# Patient Record
Sex: Female | Born: 1955 | Race: White | Hispanic: No | Marital: Married | Smoking: Never smoker
Health system: Southern US, Community
[De-identification: ages and names within clinical notes are randomized; demographics above are authoritative.]

## PROBLEM LIST (undated history)

## (undated) DIAGNOSIS — C4491 Basal cell carcinoma of skin, unspecified: Secondary | ICD-10-CM

## (undated) DIAGNOSIS — D0590 Unspecified type of carcinoma in situ of unspecified breast: Secondary | ICD-10-CM

## (undated) DIAGNOSIS — E785 Hyperlipidemia, unspecified: Secondary | ICD-10-CM

## (undated) DIAGNOSIS — N841 Polyp of cervix uteri: Secondary | ICD-10-CM

## (undated) DIAGNOSIS — C801 Malignant (primary) neoplasm, unspecified: Secondary | ICD-10-CM

## (undated) DIAGNOSIS — D259 Leiomyoma of uterus, unspecified: Secondary | ICD-10-CM

## (undated) HISTORY — DX: Basal cell carcinoma of skin, unspecified: C44.91

## (undated) HISTORY — PX: TONSILLECTOMY: SUR1361

## (undated) HISTORY — DX: Unspecified type of carcinoma in situ of unspecified breast: D05.90

## (undated) HISTORY — DX: Polyp of cervix uteri: N84.1

## (undated) HISTORY — PX: WISDOM TOOTH EXTRACTION: SHX21

## (undated) HISTORY — PX: DILATION AND CURETTAGE OF UTERUS: SHX78

## (undated) HISTORY — PX: BREAST LUMPECTOMY: SHX2

## (undated) HISTORY — DX: Leiomyoma of uterus, unspecified: D25.9

## (undated) HISTORY — DX: Malignant (primary) neoplasm, unspecified: C80.1

---

## 1994-09-09 DIAGNOSIS — C801 Malignant (primary) neoplasm, unspecified: Secondary | ICD-10-CM

## 1994-09-09 HISTORY — PX: SKIN CANCER EXCISION: SHX779

## 1994-09-09 HISTORY — DX: Malignant (primary) neoplasm, unspecified: C80.1

## 1996-04-09 HISTORY — PX: ECTOPIC PREGNANCY SURGERY: SHX613

## 1997-11-07 ENCOUNTER — Other Ambulatory Visit: Admission: RE | Admit: 1997-11-07 | Discharge: 1997-11-07 | Payer: Self-pay | Admitting: Gynecology

## 1998-06-01 ENCOUNTER — Other Ambulatory Visit: Admission: RE | Admit: 1998-06-01 | Discharge: 1998-06-01 | Payer: Self-pay | Admitting: Gynecology

## 1998-11-28 ENCOUNTER — Other Ambulatory Visit: Admission: RE | Admit: 1998-11-28 | Discharge: 1998-11-28 | Payer: Self-pay | Admitting: Gynecology

## 1999-06-14 ENCOUNTER — Other Ambulatory Visit: Admission: RE | Admit: 1999-06-14 | Discharge: 1999-06-14 | Payer: Self-pay | Admitting: Gynecology

## 2000-06-30 ENCOUNTER — Other Ambulatory Visit: Admission: RE | Admit: 2000-06-30 | Discharge: 2000-06-30 | Payer: Self-pay | Admitting: Gynecology

## 2001-07-22 ENCOUNTER — Other Ambulatory Visit: Admission: RE | Admit: 2001-07-22 | Discharge: 2001-07-22 | Payer: Self-pay | Admitting: Gynecology

## 2001-12-08 ENCOUNTER — Other Ambulatory Visit: Admission: RE | Admit: 2001-12-08 | Discharge: 2001-12-08 | Payer: Self-pay | Admitting: Gynecology

## 2002-07-15 ENCOUNTER — Other Ambulatory Visit: Admission: RE | Admit: 2002-07-15 | Discharge: 2002-07-15 | Payer: Self-pay | Admitting: Gynecology

## 2003-08-09 ENCOUNTER — Other Ambulatory Visit: Admission: RE | Admit: 2003-08-09 | Discharge: 2003-08-09 | Payer: Self-pay | Admitting: Gynecology

## 2004-08-20 ENCOUNTER — Other Ambulatory Visit: Admission: RE | Admit: 2004-08-20 | Discharge: 2004-08-20 | Payer: Self-pay | Admitting: Gynecology

## 2004-12-14 ENCOUNTER — Ambulatory Visit: Admission: RE | Admit: 2004-12-14 | Discharge: 2004-12-14 | Payer: Self-pay | Admitting: Gynecology

## 2005-02-12 ENCOUNTER — Ambulatory Visit (HOSPITAL_COMMUNITY): Admission: RE | Admit: 2005-02-12 | Discharge: 2005-02-12 | Payer: Self-pay | Admitting: Gynecology

## 2005-02-12 ENCOUNTER — Encounter (INDEPENDENT_AMBULATORY_CARE_PROVIDER_SITE_OTHER): Payer: Self-pay | Admitting: Specialist

## 2005-04-05 ENCOUNTER — Ambulatory Visit: Admission: RE | Admit: 2005-04-05 | Discharge: 2005-04-05 | Payer: Self-pay | Admitting: Gynecology

## 2005-09-09 DIAGNOSIS — D259 Leiomyoma of uterus, unspecified: Secondary | ICD-10-CM

## 2005-09-09 HISTORY — DX: Leiomyoma of uterus, unspecified: D25.9

## 2006-05-28 ENCOUNTER — Ambulatory Visit (HOSPITAL_COMMUNITY): Admission: RE | Admit: 2006-05-28 | Discharge: 2006-05-28 | Payer: Self-pay | Admitting: Obstetrics & Gynecology

## 2009-07-10 ENCOUNTER — Ambulatory Visit (HOSPITAL_COMMUNITY): Admission: RE | Admit: 2009-07-10 | Discharge: 2009-07-10 | Payer: Self-pay | Admitting: Obstetrics & Gynecology

## 2009-10-10 DIAGNOSIS — N841 Polyp of cervix uteri: Secondary | ICD-10-CM

## 2009-10-10 HISTORY — PX: CERVICAL POLYPECTOMY: SHX88

## 2009-10-10 HISTORY — DX: Polyp of cervix uteri: N84.1

## 2009-10-13 ENCOUNTER — Ambulatory Visit (HOSPITAL_COMMUNITY): Admission: RE | Admit: 2009-10-13 | Discharge: 2009-10-13 | Payer: Self-pay | Admitting: Obstetrics & Gynecology

## 2010-11-12 ENCOUNTER — Other Ambulatory Visit: Payer: Self-pay | Admitting: Radiology

## 2010-11-13 ENCOUNTER — Other Ambulatory Visit: Payer: Self-pay | Admitting: Radiology

## 2010-11-13 DIAGNOSIS — C50911 Malignant neoplasm of unspecified site of right female breast: Secondary | ICD-10-CM

## 2010-11-15 ENCOUNTER — Ambulatory Visit
Admission: RE | Admit: 2010-11-15 | Discharge: 2010-11-15 | Disposition: A | Discharge status: Home / Self Care | Payer: BC Managed Care – PPO | Source: Ambulatory Visit | Attending: Radiology | Admitting: Radiology

## 2010-11-15 DIAGNOSIS — C50911 Malignant neoplasm of unspecified site of right female breast: Secondary | ICD-10-CM

## 2010-11-15 MED ORDER — GADOBENATE DIMEGLUMINE 529 MG/ML IV SOLN
9.0000 mL | Freq: Once | INTRAVENOUS | Status: AC | PRN
  Administered 2010-11-15: 9 mL via INTRAVENOUS

## 2010-11-19 ENCOUNTER — Other Ambulatory Visit (HOSPITAL_COMMUNITY): Payer: Self-pay | Admitting: General Surgery

## 2010-11-19 DIAGNOSIS — C50919 Malignant neoplasm of unspecified site of unspecified female breast: Secondary | ICD-10-CM

## 2010-11-28 LAB — CBC
HCT: 42.9 % (ref 36.0–46.0)
Hemoglobin: 14.3 g/dL (ref 12.0–15.0)
MCHC: 33.4 g/dL (ref 30.0–36.0)
MCV: 93.1 fL (ref 78.0–100.0)
Platelets: 264 10*3/uL (ref 150–400)
RBC: 4.61 MIL/uL (ref 3.87–5.11)
RDW: 13.8 % (ref 11.5–15.5)
WBC: 6.6 10*3/uL (ref 4.0–10.5)

## 2010-11-28 LAB — PREGNANCY, URINE: Preg Test, Ur: NEGATIVE

## 2010-11-29 ENCOUNTER — Encounter (HOSPITAL_BASED_OUTPATIENT_CLINIC_OR_DEPARTMENT_OTHER)
Admission: RE | Admit: 2010-11-29 | Discharge: 2010-11-29 | Disposition: A | Discharge status: Home / Self Care | Payer: BC Managed Care – PPO | Source: Ambulatory Visit | Attending: General Surgery | Admitting: General Surgery

## 2010-11-29 ENCOUNTER — Ambulatory Visit
Admission: RE | Admit: 2010-11-29 | Discharge: 2010-11-29 | Disposition: A | Discharge status: Home / Self Care | Payer: No Typology Code available for payment source | Source: Ambulatory Visit | Attending: General Surgery | Admitting: General Surgery

## 2010-11-29 ENCOUNTER — Other Ambulatory Visit: Payer: Self-pay | Admitting: General Surgery

## 2010-11-29 DIAGNOSIS — Z01811 Encounter for preprocedural respiratory examination: Secondary | ICD-10-CM

## 2010-11-29 LAB — DIFFERENTIAL
Basophils Absolute: 0.1 10*3/uL (ref 0.0–0.1)
Basophils Relative: 1 % (ref 0–1)
Eosinophils Absolute: 0 10*3/uL (ref 0.0–0.7)
Eosinophils Relative: 0 % (ref 0–5)
Lymphocytes Relative: 32 % (ref 12–46)
Lymphs Abs: 2.4 10*3/uL (ref 0.7–4.0)
Monocytes Absolute: 0.7 10*3/uL (ref 0.1–1.0)
Monocytes Relative: 9 % (ref 3–12)
Neutro Abs: 4.3 10*3/uL (ref 1.7–7.7)
Neutrophils Relative %: 57 % (ref 43–77)

## 2010-11-29 LAB — COMPREHENSIVE METABOLIC PANEL
ALT: 37 U/L — ABNORMAL HIGH (ref 0–35)
AST: 21 U/L (ref 0–37)
Albumin: 4.2 g/dL (ref 3.5–5.2)
Alkaline Phosphatase: 34 U/L — ABNORMAL LOW (ref 39–117)
BUN: 10 mg/dL (ref 6–23)
CO2: 30 mEq/L (ref 19–32)
Calcium: 9.6 mg/dL (ref 8.4–10.5)
Chloride: 102 mEq/L (ref 96–112)
Creatinine, Ser: 0.64 mg/dL (ref 0.4–1.2)
GFR calc Af Amer: >60 mL/min (ref >60)
GFR calc non Af Amer: >60 mL/min (ref >60)
Glucose, Bld: 98 mg/dL (ref 70–99)
Potassium: 5.1 mEq/L (ref 3.5–5.1)
Sodium: 139 mEq/L (ref 135–145)
Total Bilirubin: 0.9 mg/dL (ref 0.3–1.2)
Total Protein: 7.1 g/dL (ref 6.0–8.3)

## 2010-11-29 LAB — CBC
HCT: 42.7 % (ref 36.0–46.0)
Hemoglobin: 14 g/dL (ref 12.0–15.0)
MCH: 30.8 pg (ref 26.0–34.0)
MCHC: 32.8 g/dL (ref 30.0–36.0)
MCV: 93.8 fL (ref 78.0–100.0)
Platelets: 266 10*3/uL (ref 150–400)
RBC: 4.55 MIL/uL (ref 3.87–5.11)
RDW: 13.9 % (ref 11.5–15.5)
WBC: 7.5 10*3/uL (ref 4.0–10.5)

## 2010-11-29 LAB — CANCER ANTIGEN 27.29: CA 27.29: 6 U/mL (ref 0–39)

## 2010-12-03 ENCOUNTER — Other Ambulatory Visit: Payer: Self-pay | Admitting: General Surgery

## 2010-12-03 ENCOUNTER — Ambulatory Visit (HOSPITAL_BASED_OUTPATIENT_CLINIC_OR_DEPARTMENT_OTHER)
Admission: RE | Admit: 2010-12-03 | Discharge: 2010-12-03 | Disposition: A | Discharge status: Home / Self Care | Payer: BC Managed Care – PPO | Source: Ambulatory Visit | Attending: General Surgery | Admitting: General Surgery

## 2010-12-03 ENCOUNTER — Other Ambulatory Visit (HOSPITAL_COMMUNITY): Payer: No Typology Code available for payment source

## 2010-12-03 ENCOUNTER — Ambulatory Visit (HOSPITAL_COMMUNITY): Payer: BC Managed Care – PPO | Attending: General Surgery

## 2010-12-03 DIAGNOSIS — D0590 Unspecified type of carcinoma in situ of unspecified breast: Secondary | ICD-10-CM | POA: Insufficient documentation

## 2010-12-03 DIAGNOSIS — J449 Chronic obstructive pulmonary disease, unspecified: Secondary | ICD-10-CM | POA: Insufficient documentation

## 2010-12-03 DIAGNOSIS — J4489 Other specified chronic obstructive pulmonary disease: Secondary | ICD-10-CM | POA: Insufficient documentation

## 2010-12-03 DIAGNOSIS — C50919 Malignant neoplasm of unspecified site of unspecified female breast: Secondary | ICD-10-CM

## 2010-12-03 DIAGNOSIS — Z01812 Encounter for preprocedural laboratory examination: Secondary | ICD-10-CM | POA: Insufficient documentation

## 2010-12-03 DIAGNOSIS — Z87891 Personal history of nicotine dependence: Secondary | ICD-10-CM | POA: Insufficient documentation

## 2010-12-03 DIAGNOSIS — Z01810 Encounter for preprocedural cardiovascular examination: Secondary | ICD-10-CM | POA: Insufficient documentation

## 2010-12-03 MED ORDER — TECHNETIUM TC 99M SULFUR COLLOID FILTERED
1.0000 | Freq: Once | INTRAVENOUS | Status: AC | PRN
  Administered 2010-12-03: 1 via INTRADERMAL

## 2010-12-05 NOTE — Op Note (Signed)
Connie Miller, Connie Miller                ACCOUNT NO.:  [id number]  MEDICAL RECORD NO.:  [id number]           PATIENT TYPE:  LOCATION:                                 FACILITY:  PHYSICIAN:  Juanetta Gosling, MD     DATE OF BIRTH:  DATE OF PROCEDURE:  12/03/2010 DATE OF DISCHARGE:                              OPERATIVE REPORT   PREOPERATIVE DIAGNOSIS:  Stage 0 right breast cancer.  POSTOPERATIVE DIAGNOSIS:  Stage 0 right breast cancer.  PROCEDURE: 1. Right breast wire-guided lumpectomy. 2. Injection of blue dye from met sentinel node identification. 3. Right axillary sentinel node biopsy x1 with count being 323.  SURGEON:  Troy Sine. Dwain Sarna, MD  ASSISTANT:  None.  ANESTHESIA:  General.  SPECIMENS: 1. Right breast tissue marked with a paint kit. 2. Right axillary sentinel node that was hot at 323 and blue.  DISPOSITION OF SPECIMEN:  Pathology.  ESTIMATED BLOOD LOSS:  Minimal.  COMPLICATIONS:  None.  DRAINS:  None.  DISPOSITION:  The patient to recovery room in stable condition.  INDICATIONS:  This is a 55 year old female underwent routine screening mammogram who had a short interval followup of calcifications in March 2012.  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 MRI does not show where the other significant abnormality.  She and I discussed all of the different options, decided to perform a lumpectomy. This area does appear just to be DCIS but due to its location in the very upper outer quadrant near her axilla,  I did discuss with her a sentinel node biopsy due to location and I recommended this to her as part of her treatment.  She understood the risks and benefits of this prior to beginning procedure.  After informed consent was obtained, the patient [facility] she had a wire placed by Dr. Derinda Late.  I had her mammograms and the report  available for my review.  She was then brought to [facility] where she was administered technetium in the standard periareolar fashion.  She was then taken to the operating room, was administered 1 g of intravenous cefazolin.  Sequential compression devices were placed on lower extremities prior to induction of anesthesia.  She was then placed under general anesthesia with an LMA.  Surgical time-out was then performed.  PROCEDURE: I then injected one mL in 4 cardinal positions around her areola of methylene blue saline mixture massages for 2 minutes.  She was then prepped and draped in standard sterile surgical fashion.  I then made a radial incision overlying the wire.  I did ellipse a small piece of skin overlying this as well.  This was taken down to the pectoralis muscle including the pectoralis fascia, Faxitron mammogram was taken to confirm removal of the calcifications as well as the clip and the wire.  The margins were all clear on this as well.  Hemostasis was observed.  I did lift the breast tissue off the pectoralis little bit all four directions.  I then performed a sentinel  node biopsy through the same incision.  I entered her axilla.  I used the NeoProbe to identify a hot node, this was also blue, I removed this, the background counts were less than 30 and the count of sentinel node was 323.  I then observed hemostasis.  I closed her axilla with 3-0 Vicryl suture.  I placed 2 clips in the deep position on the pectoralis muscle and one clip in each cardinal position around the breast tissue.  I then closed the deep breast tissue with the 2-0 Vicryl.  I did not close the lateral most portion of this and I then closed the dermis with a 3-0 Vicryl.  The skin with a 4-0 Monocryl in a subcuticular fashion.  Steri-Strips and sterile dressing were placed, a total of 20 mL of 0.25% Marcaine were infiltrated.  I then placed a breast binder round her.  She tolerated this well,  was extubated in the operating room, and transferred to recovery room in stable condition.     Juanetta Gosling, MD     MCW/MEDQ  D:  12/03/2010  T:  12/04/2010  Job:  [id number]  cc:   Barry Dienes. Eloise Harman, M.D. Dr. Jeralyn Ruths  Electronically Signed by Emelia Loron MD on 12/05/2010 12:27:17 PM

## 2010-12-10 ENCOUNTER — Encounter: Payer: No Typology Code available for payment source | Admitting: Oncology

## 2010-12-12 ENCOUNTER — Ambulatory Visit: Payer: BC Managed Care – PPO | Attending: Radiation Oncology | Admitting: Radiation Oncology

## 2010-12-12 DIAGNOSIS — L299 Pruritus, unspecified: Secondary | ICD-10-CM | POA: Insufficient documentation

## 2010-12-12 DIAGNOSIS — C50419 Malignant neoplasm of upper-outer quadrant of unspecified female breast: Secondary | ICD-10-CM | POA: Insufficient documentation

## 2010-12-12 DIAGNOSIS — Z803 Family history of malignant neoplasm of breast: Secondary | ICD-10-CM | POA: Insufficient documentation

## 2010-12-12 DIAGNOSIS — Z85828 Personal history of other malignant neoplasm of skin: Secondary | ICD-10-CM | POA: Insufficient documentation

## 2010-12-12 DIAGNOSIS — Z510 Encounter for antineoplastic radiation therapy: Secondary | ICD-10-CM | POA: Insufficient documentation

## 2010-12-12 DIAGNOSIS — Z7982 Long term (current) use of aspirin: Secondary | ICD-10-CM | POA: Insufficient documentation

## 2010-12-12 DIAGNOSIS — F411 Generalized anxiety disorder: Secondary | ICD-10-CM | POA: Insufficient documentation

## 2010-12-12 DIAGNOSIS — Y842 Radiological procedure and radiotherapy as the cause of abnormal reaction of the patient, or of later complication, without mention of misadventure at the time of the procedure: Secondary | ICD-10-CM | POA: Insufficient documentation

## 2010-12-24 ENCOUNTER — Encounter: Payer: BC Managed Care – PPO | Admitting: Genetic Counselor

## 2010-12-24 ENCOUNTER — Encounter: Payer: BC Managed Care – PPO | Admitting: Oncology

## 2011-01-04 ENCOUNTER — Encounter (INDEPENDENT_AMBULATORY_CARE_PROVIDER_SITE_OTHER): Payer: Self-pay | Admitting: General Surgery

## 2011-01-23 ENCOUNTER — Other Ambulatory Visit: Payer: Self-pay | Admitting: Dermatology

## 2011-01-25 NOTE — Op Note (Signed)
Connie Miller, Connie Miller                ACCOUNT NO.:  [id number]   MEDICAL RECORD NO.:  [id number]          PATIENT TYPE:  AMB   LOCATION:  DAY                          FACILITY:  [facility]   PHYSICIAN:  De Blanch, M.D.DATE OF BIRTH:  [birth year]   DATE OF PROCEDURE:  02/12/2005  DATE OF DISCHARGE:                                 OPERATIVE REPORT   PREOPERATIVE DIAGNOSIS:  Postmenopausal bleeding, endometrial mass, cervical  stenosis.   POSTOPERATIVE DIAGNOSIS:  Postmenopausal bleeding, endometrial mass,  cervical stenosis.   PROCEDURE:  Examination under anesthesia, D&C under ultrasound guidance.   SURGEON:  De Blanch, M.D.   FIRST ASSISTANT:  Telford Nab, R.N.   ANESTHESIA:  General LMA.   ESTIMATED BLOOD LOSS:  Minimal.   SURGICAL FINDINGS:  Examination under anesthesia revealed a stenotic upper  vagina and a cervix which was flush with the upper vagina. There were some  adhesions in the upper vagina initially obscured the view of the cervix. The  cervix os was stenotic. With ultrasound guidance, we were able to negotiate  the endocervical canal into the endometrial cavity. Imaging of the  endometrial cavity with ultrasound showed a sonolucent area consistent with  a polyp. At the completion of the surgical procedure, there was no residual  tissue identifiable by ultrasound.   DESCRIPTION OF PROCEDURE:  The patient was brought to the operating room and  after satisfactory attainment of general anesthesia was placed in the  lithotomy position in candy cane stirrups. Perineum, vagina and vulva were  prepped with Betadine, the patient was draped and a Foley catheter was  inserted. The bladder was filled with 250 mL of saline. Ultrasound imaging  of the pelvis was then begun identifying the uterus with its endometrial  mass. A weighted speculum was initially placed in the vagina, but because  the vagina was stenotic a Deaver was substituted both posteriorly  and  anteriorly. The cervix was identified and grasped with a tenaculum.  Adhesions in the upper vagina were opened with blunt dissection. Probing of  the cervix identified a stenotic cervical os and a wire probe was then  introduced through the cervical canal and into the endometrial cavity. This  was confirmed by ultrasound. The cervix was then dilated sequentially to a  23 gauge Pratt dilator. All of this was performed under ultrasound guidance.  A Randall stone forceps was then introduced into the uterus and with several  passes a considerable amount of what appeared to be endometrial polyp was  then removed. A sharp curette was then used to curette the entire  endometrial cavity down to the level of the basalis. Additional passes with  the Randall stone forceps retrieved  additional tissue. The uterus was then imaged and found to be empty. The  tenaculum was removed. The bladder was emptied and a Foley catheter removed.  The patient was awakened from anesthesia and taken to the recovery room in  satisfactory condition. Sponge, needle and instrument counts correct x2.       DC/MEDQ  D:  02/12/2005  T:  02/12/2005  Job:  [id number]  cc:   Leatha Gilding. Mezer, M.D.  [address]  [place]  [place] [address]  Fax: [phone]   Telford Nab, R.N.  [phone] [address]  [place], [place] [address]

## 2011-01-25 NOTE — Consult Note (Signed)
NAMEGISSELLA, Connie Miller                ACCOUNT NO.:  [id number]   MEDICAL RECORD NO.:  [id number]          PATIENT TYPE:  OUT   LOCATION:  GYN                          FACILITY:  [facility]   PHYSICIAN:  De Blanch, M.D.DATE OF BIRTH:  10/10/1955   DATE OF CONSULTATION:  DATE OF DISCHARGE:                                   CONSULTATION   A 55 year old white female returns, having undergone a D&C on February 12, 2005.  She had cervical stenosis and postmenopausal bleeding.  Using ultrasound  guidance, I was able to perform a D&C, which revealed a benign endometrial  polyp with secretory changes.  No malignancy or hyperplasia was noted.  The  patient has had an uncomplicated postoperative course.   PHYSICAL EXAMINATION:  VITAL SIGNS:  Weight 122 pounds.  ABDOMEN:  Soft and nontender.  No mass, organomegaly, ascites, or hernias  are noted.  PELVIC:  EG/BUS, vagina, bladder, and urethra are normal.  The cervix is  visualized, and the os appears to be patent at the present time.  No  bleeding is noted.  Bimanual reveals normal uterus.  No adnexal masses  noted.   IMPRESSION:  Good postoperative recovery following dilatation and curettage.  The patient is given the okay to return to full levels of activity,  including sexual intercourse.   I reviewed the patient's pathology and explained the natural history of  endometrial polyps.  She remains premenopausal, having regular cyclic menses  and has had one period since her D&C.  I would recommend she have annual  gynecologic examinations or be seen as needed for other gynecologic  problems.       DC/MEDQ  D:  04/05/2005  T:  04/05/2005  Job:  [id number]   cc:   Leatha Gilding. Mezer, M.D.  [address]  [place]  [place] [address]  Fax: [phone]   Telford Nab, R.N.  [phone]  [place], [place] [address]

## 2011-01-25 NOTE — Consult Note (Signed)
Connie Miller, Connie Miller                ACCOUNT NO.:  [id number]   MEDICAL RECORD NO.:  [id number]          PATIENT TYPE:  OUT   LOCATION:  GYN                          FACILITY:  [facility]   PHYSICIAN:  De Blanch, M.D.DATE OF BIRTH:  [birth year]   DATE OF CONSULTATION:  12/14/2004  DATE OF DISCHARGE:                                   CONSULTATION   NEW PATIENT CONSULTATION   REASON FOR CONSULTATION:  A 55 year old white female seen in consultation at  the request of Dr. Teodora Medici regarding apparent uterine polyps.   At the time of a routine office visit, it was noted the patient was having  intermenstrual bleeding and a saline hysterosonogram was performed on  September 19, 2004. Two polyps were identified. Subsequently, the patient was  taken to the outpatient operating room where a D&C was attempted. However,  the patient's cervix was extremely stenotic and entry into the endometrial  cavity could not be confirmed. The curetted tissue was essentially blood  clot.   The patient has no particular explanation for why she would have cervical  stenosis. She did undergo two hysterosalpingograms in the past but has not  had cryotherapy or cold knife conization or prior D&C's.   She denies any abdominal or pelvic pain or any other GI or GU symptoms.   PAST MEDICAL HISTORY:  Medical illnesses:  Migraine headaches.   Past surgical procedures:  Attempted D&C, in vitro fertilization,  laparoscopic salpingostomy for an ectopic pregnancy that required  postoperative methotrexate.   DRUG ALLERGIES:  None.   CURRENT MEDICATIONS:  Imitrex, multivitamins.   OBSTETRICAL HISTORY:  Gravida 0.   REVIEW OF SYSTEMS:  Negative except as noted above.   PHYSICAL EXAMINATION:  VITAL SIGNS:  Weight 123 pounds, height 5 feet 2  inches. Blood pressure 122/78, pulse 108.  GENERAL:  The patient is a healthy white female in no acute distress.  HEENT:  Negative.  NECK:  Supple without  thyromegaly.  LYMPH:  There is no supraclavicular or inguinal adenopathy.  ABDOMEN:  Soft, nontender. No masses, organomegaly, ascites or hernias are  noted.  PELVIC:  EG/BUS, vagina, bladder, urethra are normal except at the apex of  the vagina in the left fornix there is a considerable amount of scar tissue  resulting in deviation of the cervix, making the cervix difficult to  visualize. Bimanual exam reveals a normal uterus, no adnexal masses are  noted.   IMPRESSION:  Findings suggestive of endometrial polyps on sonohysterogram  with intermenstrual bleeding. Management options were discussed with the  patient and her husband at some length. The three options of management that  I would consider would be:  1.  Continued observation.  2.  Second attempt at [facility] using ultrasound guidance.  3.  Abdominal hysterectomy (given the patient's nulliparous status and the      narrowness of her vagina I do not believe a vaginal hysterectomy would      be accomplished easily).   The pros and cons of each of these approaches were discussed with the  patient and her husband at some  length. If she did choose to undergo a  hysterectomy, the pros and cons of preserving or removing tubes and ovaries  was also discussed. At the conclusion of the consultation the patient and  her husband wish to consider their options further and will contact Dr.  Chevis Pretty with their decision. We did discuss the possibility that if on  hysterectomy she was found to have endometrial cancer, the need for  peritoneal washings, bilateral salpingo-oophorectomy, and possibly pelvic  and periaortic lymphadenectomy. All their questions are answered and they  will contact us if we can be of additional service.      DC/MEDQ  D:  12/14/2004  T:  12/14/2004  Job:  [id number]   cc:   Leatha Gilding. Mezer, M.D.  [address]  [place]  [place] [address]  Fax: [phone]   Telford Nab, R.N.  [phone] [address]  [place],  [place] [address]

## 2011-02-14 ENCOUNTER — Encounter (HOSPITAL_BASED_OUTPATIENT_CLINIC_OR_DEPARTMENT_OTHER): Payer: BC Managed Care – PPO | Admitting: Oncology

## 2011-02-14 DIAGNOSIS — Z170 Estrogen receptor positive status [ER+]: Secondary | ICD-10-CM

## 2011-02-14 DIAGNOSIS — C50419 Malignant neoplasm of upper-outer quadrant of unspecified female breast: Secondary | ICD-10-CM

## 2011-03-18 ENCOUNTER — Encounter (INDEPENDENT_AMBULATORY_CARE_PROVIDER_SITE_OTHER): Payer: Self-pay | Admitting: General Surgery

## 2011-03-18 ENCOUNTER — Ambulatory Visit (INDEPENDENT_AMBULATORY_CARE_PROVIDER_SITE_OTHER): Payer: BC Managed Care – PPO | Admitting: General Surgery

## 2011-03-18 DIAGNOSIS — D0590 Unspecified type of carcinoma in situ of unspecified breast: Secondary | ICD-10-CM

## 2011-03-18 DIAGNOSIS — D0591 Unspecified type of carcinoma in situ of right breast: Secondary | ICD-10-CM | POA: Insufficient documentation

## 2011-03-18 NOTE — Progress Notes (Signed)
Subjective:     Patient ID: Connie Miller, female   DOB: 1955/11/26, 55 y.o.   MRN: [id number]    There were no vitals taken for this visit.    HPI This is a 55 her old female who I initially saw in March of 55. She had a mammographic abnormality on the right at that time. The core biopsy showed DCIS, atypical ductal hyperplasia. MRI did not show any other changes. She was taken to the operating room on March 26 underwent a right breast wire-guided lumpectomy and a right axillary sentinel lymph node biopsy. Her final pathology returned as no residual DCIS, atypical lobular hyperplasia, margin negative, one lymph node negative for tumor. This was ER positive at 99%. This was PR positive at 100%. She did well postoperatively. She had some initial difficulty with her right arm but is having no trouble with that at this point. She then went on to complete radiation therapy with Dr. Arnette Schaumann. She also is not taking tamoxifen per Pierce Crane. She comes in today for followup without any real complaints.  Review of Systems     Objective:   Physical Exam  Pulmonary/Chest: Right breast exhibits no inverted nipple, no nipple discharge and no skin change. Left breast exhibits no inverted nipple, no mass, no nipple discharge, no skin change and no tenderness.   Well healing right upper outer quadrant breast incision with some scar tissue present but no right breast masses, no right axillary adenopathy    Assessment:     Stage 0 right breast cancer s/p lumpectomy, sentinel node biopsy, xrt and now on tamoxifen    Plan:     Continue monthly SBE MMG in March I told her I would see her six months after Dr. Donnie Coffin We discussed at length her diagnosis as well as reason for tamoxifen

## 2011-05-01 ENCOUNTER — Encounter (INDEPENDENT_AMBULATORY_CARE_PROVIDER_SITE_OTHER): Payer: Self-pay | Admitting: General Surgery

## 2011-05-16 ENCOUNTER — Other Ambulatory Visit: Payer: Self-pay | Admitting: Oncology

## 2011-05-16 ENCOUNTER — Encounter (HOSPITAL_BASED_OUTPATIENT_CLINIC_OR_DEPARTMENT_OTHER): Payer: BC Managed Care – PPO | Admitting: Oncology

## 2011-05-16 DIAGNOSIS — C50419 Malignant neoplasm of upper-outer quadrant of unspecified female breast: Secondary | ICD-10-CM

## 2011-05-16 LAB — CBC WITH DIFFERENTIAL/PLATELET
BASO%: 1.3 % (ref 0.0–2.0)
Basophils Absolute: 0.1 10*3/uL (ref 0.0–0.1)
EOS%: 0.2 % (ref 0.0–7.0)
Eosinophils Absolute: 0 10*3/uL (ref 0.0–0.5)
HCT: 42.4 % (ref 34.8–46.6)
HGB: 14.2 g/dL (ref 11.6–15.9)
LYMPH%: 25.5 % (ref 14.0–49.7)
MCH: 31.6 pg (ref 25.1–34.0)
MCHC: 33.5 g/dL (ref 31.5–36.0)
MCV: 94.3 fL (ref 79.5–101.0)
MONO#: 0.4 10*3/uL (ref 0.1–0.9)
MONO%: 7.2 % (ref 0.0–14.0)
NEUT#: 3.9 10*3/uL (ref 1.5–6.5)
NEUT%: 65.8 % (ref 38.4–76.8)
Platelets: 197 10*3/uL (ref 145–400)
RBC: 4.49 10*6/uL (ref 3.70–5.45)
RDW: 13.3 % (ref 11.2–14.5)
WBC: 5.9 10*3/uL (ref 3.9–10.3)
lymph#: 1.5 10*3/uL (ref 0.9–3.3)

## 2011-05-17 LAB — COMPREHENSIVE METABOLIC PANEL
ALT: 21 U/L (ref 0–35)
AST: 20 U/L (ref 0–37)
Albumin: 4.5 g/dL (ref 3.5–5.2)
Alkaline Phosphatase: 30 U/L — ABNORMAL LOW (ref 39–117)
BUN: 15 mg/dL (ref 6–23)
CO2: 29 mEq/L (ref 19–32)
Calcium: 9.9 mg/dL (ref 8.4–10.5)
Chloride: 103 mEq/L (ref 96–112)
Creatinine, Ser: 0.74 mg/dL (ref 0.50–1.10)
Glucose, Bld: 92 mg/dL (ref 70–99)
Potassium: 3.9 mEq/L (ref 3.5–5.3)
Sodium: 141 mEq/L (ref 135–145)
Total Bilirubin: 0.7 mg/dL (ref 0.3–1.2)
Total Protein: 7.2 g/dL (ref 6.0–8.3)

## 2011-05-17 LAB — VITAMIN D 25 HYDROXY (VIT D DEFICIENCY, FRACTURES): Vit D, 25-Hydroxy: 41 ng/mL (ref 30–89)

## 2011-05-20 ENCOUNTER — Ambulatory Visit: Payer: No Typology Code available for payment source | Admitting: Radiation Oncology

## 2011-08-28 ENCOUNTER — Encounter: Payer: Self-pay | Admitting: Oncology

## 2011-09-12 ENCOUNTER — Ambulatory Visit: Payer: BC Managed Care – PPO | Admitting: Psychiatry

## 2011-09-12 NOTE — Progress Notes (Unsigned)
Patient seen for initial psychological evaluation.  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  The patient is amenable to continuing in therapy and her next appointment is 09-18-2011.

## 2011-09-13 ENCOUNTER — Other Ambulatory Visit: Payer: BC Managed Care – PPO

## 2011-09-18 ENCOUNTER — Ambulatory Visit (INDEPENDENT_AMBULATORY_CARE_PROVIDER_SITE_OTHER): Payer: BC Managed Care – PPO | Admitting: Psychiatry

## 2011-09-18 ENCOUNTER — Telehealth: Payer: Self-pay | Admitting: *Deleted

## 2011-09-18 DIAGNOSIS — F329 Major depressive disorder, single episode, unspecified: Secondary | ICD-10-CM

## 2011-09-18 NOTE — Telephone Encounter (Signed)
left voice message to inform the patient of the new date and time on 10-09-2011

## 2011-09-20 ENCOUNTER — Ambulatory Visit: Payer: BC Managed Care – PPO | Admitting: Oncology

## 2011-10-02 ENCOUNTER — Other Ambulatory Visit (HOSPITAL_BASED_OUTPATIENT_CLINIC_OR_DEPARTMENT_OTHER): Payer: BC Managed Care – PPO | Admitting: Lab

## 2011-10-02 ENCOUNTER — Ambulatory Visit (INDEPENDENT_AMBULATORY_CARE_PROVIDER_SITE_OTHER): Payer: BC Managed Care – PPO | Admitting: Psychiatry

## 2011-10-02 DIAGNOSIS — F329 Major depressive disorder, single episode, unspecified: Secondary | ICD-10-CM

## 2011-10-02 DIAGNOSIS — C50419 Malignant neoplasm of upper-outer quadrant of unspecified female breast: Secondary | ICD-10-CM

## 2011-10-02 LAB — CBC WITH DIFFERENTIAL/PLATELET
BASO%: 0.7 % (ref 0.0–2.0)
Basophils Absolute: 0 10*3/uL (ref 0.0–0.1)
EOS%: 1 % (ref 0.0–7.0)
Eosinophils Absolute: 0 10*3/uL (ref 0.0–0.5)
HCT: 41.4 % (ref 34.8–46.6)
HGB: 14.1 g/dL (ref 11.6–15.9)
LYMPH%: 29.7 % (ref 14.0–49.7)
MCH: 31.9 pg (ref 25.1–34.0)
MCHC: 34 g/dL (ref 31.5–36.0)
MCV: 93.9 fL (ref 79.5–101.0)
MONO#: 0.4 10*3/uL (ref 0.1–0.9)
MONO%: 9.1 % (ref 0.0–14.0)
NEUT#: 2.9 10*3/uL (ref 1.5–6.5)
NEUT%: 59.5 % (ref 38.4–76.8)
Platelets: 201 10*3/uL (ref 145–400)
RBC: 4.41 10*6/uL (ref 3.70–5.45)
RDW: 13.2 % (ref 11.2–14.5)
WBC: 4.8 10*3/uL (ref 3.9–10.3)
lymph#: 1.4 10*3/uL (ref 0.9–3.3)

## 2011-10-02 LAB — COMPREHENSIVE METABOLIC PANEL
ALT: 19 U/L (ref 0–35)
AST: 20 U/L (ref 0–37)
Albumin: 4.6 g/dL (ref 3.5–5.2)
Alkaline Phosphatase: 39 U/L (ref 39–117)
BUN: 17 mg/dL (ref 6–23)
CO2: 28 mEq/L (ref 19–32)
Calcium: 9.6 mg/dL (ref 8.4–10.5)
Chloride: 105 mEq/L (ref 96–112)
Creatinine, Ser: 0.62 mg/dL (ref 0.50–1.10)
Glucose, Bld: 86 mg/dL (ref 70–99)
Potassium: 4.6 mEq/L (ref 3.5–5.3)
Sodium: 141 mEq/L (ref 135–145)
Total Bilirubin: 0.6 mg/dL (ref 0.3–1.2)
Total Protein: 6.7 g/dL (ref 6.0–8.3)

## 2011-10-02 LAB — LACTATE DEHYDROGENASE: LDH: 148 U/L (ref 94–250)

## 2011-10-02 NOTE — Progress Notes (Unsigned)
10-02-2011  Patient seen for individual psychotherapy.  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  Husband monitoring his alcohol intake.  Next appointment 10-16-2011.

## 2011-10-09 ENCOUNTER — Ambulatory Visit (HOSPITAL_BASED_OUTPATIENT_CLINIC_OR_DEPARTMENT_OTHER): Payer: BC Managed Care – PPO | Admitting: Oncology

## 2011-10-09 VITALS — BP 160/89 | HR 81 | Temp 98.2°F | Ht 61.5 in | Wt 116.2 lb

## 2011-10-09 DIAGNOSIS — D0590 Unspecified type of carcinoma in situ of unspecified breast: Secondary | ICD-10-CM

## 2011-10-09 DIAGNOSIS — F411 Generalized anxiety disorder: Secondary | ICD-10-CM

## 2011-10-10 NOTE — Progress Notes (Signed)
Hematology and Oncology Follow Up Visit  Connie Miller [id number] Aug 02, 1956 56 y.o. 10/10/2011 6:38 AM PCP Jesusita Oka Tanganika Barradas [place] Principle Diagnosis: Hx of DCIS s/p partial mastectomy 3/12 for er/pr+ DCIS; s/p xrt completed 02/22/11 on tamoxifen.  Interim History:  There have been no intercurrent illness, hospitalizations or medication changes.  Medications: I have reviewed the patient's current medications.  Allergies:  Allergies  Allergen Reactions  . Aspirin     Past Medical History, Surgical history, Social history, and Family History were reviewed and updated.  Review of Systems: Constitutional:  Negative for fever, chills, night sweats, anorexia, weight loss, pain. Cardiovascular: no chest pain or dyspnea on exertion Respiratory: no cough, shortness of breath, or wheezing Neurological: no TIA or stroke symptoms Dermatological: negative ENT: negative Skin Gastrointestinal: no abdominal pain, change in bowel habits, or black or bloody stools Genito-Urinary: no dysuria, trouble voiding, or hematuria Hematological and Lymphatic: negative Breast: negative for breast lumps Musculoskeletal: negative Remaining ROS negative.  Physical Exam: Blood pressure 160/89, pulse 81, temperature 98.2 F (36.8 C), temperature source Oral, height 5' 1.5" (1.562 m), weight 116 lb 3.2 oz (52.708 kg). ECOG: 0 General appearance: alert, cooperative and appears stated age Head: Normocephalic, without obvious abnormality, atraumatic Neck: no adenopathy, no carotid bruit, no JVD, supple, symmetrical, trachea midline and thyroid not enlarged, symmetric, no tenderness/mass/nodules Lymph nodes: Cervical, supraclavicular, and axillary nodes normal. Cardiac : regular rate and rhythm, no murmurs or gallops Pulmonary:clear to auscultation bilaterally and normal percussion bilaterally Breasts: inspection negative, no nipple discharge or bleeding, no masses or nodularity  palpable Abdomen:soft, non-tender; bowel sounds normal; no masses,  no organomegaly Extremities negative Neuro: alert, oriented, normal speech, no focal findings or movement disorder noted  Lab Results: Lab Results  Component Value Date   WBC 4.8 10/02/2011   HGB 14.1 10/02/2011   HCT 41.4 10/02/2011   MCV 93.9 10/02/2011   PLT 201 10/02/2011     Chemistry      Component Value Date/Time   NA 141 10/02/2011 0930   K 4.6 10/02/2011 0930   CL 105 10/02/2011 0930   CO2 28 10/02/2011 0930   BUN 17 10/02/2011 0930   CREATININE 0.62 10/02/2011 0930      Component Value Date/Time   CALCIUM 9.6 10/02/2011 0930   ALKPHOS 39 10/02/2011 0930   AST 20 10/02/2011 0930   ALT 19 10/02/2011 0930   BILITOT 0.6 10/02/2011 0930      .pathology. Radiological Studies: chest X-ray n/a Mammogram 3/13- wnl Bone density n/a  Impression and Plan: Connie Miller is doing well. She has ongoing anxiety and is seeing Dr Noe Gens which is helping. She is tolerating tamoxifen , apart from hot flashes . I will see her in 6 months.  More than 50% of the visit was spent in patient-related counselling   Pierce Crane, MD 1/31/20136:38 AM

## 2011-10-16 ENCOUNTER — Ambulatory Visit (INDEPENDENT_AMBULATORY_CARE_PROVIDER_SITE_OTHER): Payer: BC Managed Care – PPO | Admitting: Psychiatry

## 2011-10-16 DIAGNOSIS — F329 Major depressive disorder, single episode, unspecified: Secondary | ICD-10-CM

## 2011-10-16 DIAGNOSIS — F3289 Other specified depressive episodes: Secondary | ICD-10-CM

## 2011-10-30 ENCOUNTER — Ambulatory Visit (INDEPENDENT_AMBULATORY_CARE_PROVIDER_SITE_OTHER): Payer: BC Managed Care – PPO | Admitting: Psychiatry

## 2011-10-30 DIAGNOSIS — F329 Major depressive disorder, single episode, unspecified: Secondary | ICD-10-CM

## 2011-11-13 ENCOUNTER — Ambulatory Visit (INDEPENDENT_AMBULATORY_CARE_PROVIDER_SITE_OTHER): Payer: BC Managed Care – PPO | Admitting: Psychiatry

## 2011-11-13 DIAGNOSIS — Z630 Problems in relationship with spouse or partner: Secondary | ICD-10-CM

## 2011-11-13 DIAGNOSIS — F329 Major depressive disorder, single episode, unspecified: Secondary | ICD-10-CM

## 2011-11-13 NOTE — Progress Notes (Signed)
11-13-2011  Patient seen for individual psychotherapy.  Session focused on patient's struggles to be assertive with her husband.  Recommended she use her mother's persona as a template - mother is excellent at asserting self.  Next appointment 11-27-2011.

## 2011-11-27 ENCOUNTER — Ambulatory Visit: Payer: BC Managed Care – PPO | Admitting: Psychiatry

## 2011-12-11 ENCOUNTER — Ambulatory Visit (INDEPENDENT_AMBULATORY_CARE_PROVIDER_SITE_OTHER): Payer: BC Managed Care – PPO | Admitting: Psychiatry

## 2011-12-11 DIAGNOSIS — F329 Major depressive disorder, single episode, unspecified: Secondary | ICD-10-CM

## 2011-12-11 NOTE — Progress Notes (Signed)
12-11-2011  Patient seen for individual psychotherapy. Session focused on teaching patient TA principles as they apply to her life.  Next appointment 12-25-2011.

## 2011-12-20 ENCOUNTER — Ambulatory Visit (INDEPENDENT_AMBULATORY_CARE_PROVIDER_SITE_OTHER): Payer: BC Managed Care – PPO | Admitting: General Surgery

## 2011-12-20 ENCOUNTER — Encounter (INDEPENDENT_AMBULATORY_CARE_PROVIDER_SITE_OTHER): Payer: Self-pay | Admitting: General Surgery

## 2011-12-20 VITALS — BP 112/68 | HR 86 | Temp 98.0°F | Ht 62.0 in | Wt 118.6 lb

## 2011-12-20 DIAGNOSIS — Z853 Personal history of malignant neoplasm of breast: Secondary | ICD-10-CM

## 2011-12-20 NOTE — Progress Notes (Signed)
Subjective:     Patient ID: Connie Miller, female   DOB: October 05, 1955, 56 y.o.   MRN: [id number]  HPI This is a 56 year old female who underwent a right breast lumpectomy and sentinel node biopsy for stage 0 right breast cancer. She is now maintained on tamoxifen. She reports no real complaints since our last visit referable to her breasts at all. She is tolerating her tamoxifen except for some hot flashes. She has a mammogram that was BI-RADS 2 last month. She reports no masses or any abnormalities on her self-exam. She comes in for followup.  Review of Systems     Objective:   Physical Exam  Vitals reviewed. Constitutional: She appears well-developed and well-nourished.  Pulmonary/Chest: Right breast exhibits no inverted nipple, no mass, no nipple discharge, no skin change and no tenderness. Left breast exhibits no inverted nipple, no mass, no nipple discharge, no skin change and no tenderness. Breasts are symmetrical.    Lymphadenopathy:    She has no cervical adenopathy.    She has no axillary adenopathy.       Right: No supraclavicular adenopathy present.       Left: No supraclavicular adenopathy present.       Assessment:     History of stage 0 breast cancer    Plan:     No evidence recurrence clinically.  MMG up to date and benign. Cont self exams, yearly mmg and I will see annually for exam.

## 2011-12-25 ENCOUNTER — Ambulatory Visit (INDEPENDENT_AMBULATORY_CARE_PROVIDER_SITE_OTHER): Payer: BC Managed Care – PPO | Admitting: Psychiatry

## 2011-12-25 DIAGNOSIS — F329 Major depressive disorder, single episode, unspecified: Secondary | ICD-10-CM

## 2011-12-25 DIAGNOSIS — Z630 Problems in relationship with spouse or partner: Secondary | ICD-10-CM

## 2011-12-25 NOTE — Progress Notes (Signed)
12-25-2011  Patient seen for individual psychotherapy.  Session focused on marital issues and work goals.  Next appointment 01-08-2012.

## 2012-01-07 NOTE — Progress Notes (Signed)
01-06-2012  Patient called to cancel appointment for 01-08-2012.  She said she "wants to take a break from therapy" and will call in the future to reschedule.

## 2012-01-08 ENCOUNTER — Ambulatory Visit: Payer: BC Managed Care – PPO | Admitting: Psychiatry

## 2012-01-17 ENCOUNTER — Encounter (INDEPENDENT_AMBULATORY_CARE_PROVIDER_SITE_OTHER): Payer: Self-pay

## 2012-02-12 ENCOUNTER — Encounter: Payer: Self-pay | Admitting: Medical Oncology

## 2012-02-12 ENCOUNTER — Other Ambulatory Visit: Payer: Self-pay | Admitting: Medical Oncology

## 2012-02-12 MED ORDER — TAMOXIFEN CITRATE 10 MG PO TABS: 10.0000 mg | ORAL_TABLET | Freq: Two times a day (BID) | ORAL | Status: DC

## 2012-02-12 NOTE — Progress Notes (Signed)
Notified patient of new prescription sent to pharmacy.  Patient will no further questions at this time.  Instructed patient to call with any further needs or questions.

## 2012-02-17 ENCOUNTER — Other Ambulatory Visit: Payer: Self-pay | Admitting: *Deleted

## 2012-02-17 MED ORDER — TAMOXIFEN CITRATE 20 MG PO TABS: 20.0000 mg | ORAL_TABLET | Freq: Every day | ORAL | Status: AC

## 2012-04-06 ENCOUNTER — Other Ambulatory Visit: Payer: Self-pay | Admitting: Family

## 2012-04-06 DIAGNOSIS — D0590 Unspecified type of carcinoma in situ of unspecified breast: Secondary | ICD-10-CM

## 2012-04-07 ENCOUNTER — Telehealth: Payer: Self-pay | Admitting: *Deleted

## 2012-04-07 ENCOUNTER — Other Ambulatory Visit (HOSPITAL_BASED_OUTPATIENT_CLINIC_OR_DEPARTMENT_OTHER): Payer: BC Managed Care – PPO | Admitting: Lab

## 2012-04-07 ENCOUNTER — Ambulatory Visit (HOSPITAL_BASED_OUTPATIENT_CLINIC_OR_DEPARTMENT_OTHER): Payer: BC Managed Care – PPO | Admitting: Oncology

## 2012-04-07 VITALS — BP 121/82 | HR 109 | Temp 99.1°F | Ht 62.0 in | Wt 120.3 lb

## 2012-04-07 DIAGNOSIS — Z170 Estrogen receptor positive status [ER+]: Secondary | ICD-10-CM

## 2012-04-07 DIAGNOSIS — D0590 Unspecified type of carcinoma in situ of unspecified breast: Secondary | ICD-10-CM

## 2012-04-07 DIAGNOSIS — F411 Generalized anxiety disorder: Secondary | ICD-10-CM

## 2012-04-07 LAB — CBC WITH DIFFERENTIAL/PLATELET
BASO%: 1 % (ref 0.0–2.0)
Basophils Absolute: 0 10*3/uL (ref 0.0–0.1)
EOS%: 0.4 % (ref 0.0–7.0)
Eosinophils Absolute: 0 10*3/uL (ref 0.0–0.5)
HCT: 41.4 % (ref 34.8–46.6)
HGB: 13.7 g/dL (ref 11.6–15.9)
LYMPH%: 25.6 % (ref 14.0–49.7)
MCH: 31.1 pg (ref 25.1–34.0)
MCHC: 33.1 g/dL (ref 31.5–36.0)
MCV: 94 fL (ref 79.5–101.0)
MONO#: 0.4 10*3/uL (ref 0.1–0.9)
MONO%: 7.6 % (ref 0.0–14.0)
NEUT#: 3.2 10*3/uL (ref 1.5–6.5)
NEUT%: 65.4 % (ref 38.4–76.8)
Platelets: 217 10*3/uL (ref 145–400)
RBC: 4.41 10*6/uL (ref 3.70–5.45)
RDW: 13.2 % (ref 11.2–14.5)
WBC: 4.8 10*3/uL (ref 3.9–10.3)
lymph#: 1.2 10*3/uL (ref 0.9–3.3)

## 2012-04-07 NOTE — Telephone Encounter (Signed)
left message to inform patient of the bone density at [facility] on 06-05-2012 at 9:30am

## 2012-04-15 NOTE — Progress Notes (Signed)
Hematology and Oncology Follow Up Visit  Connie Miller [id number] June 08, 1956 56 y.o. 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  Interim History:  There have been no intercurrent illness, hospitalizations or medication changes. She remains anxious. She tolerates tamoxifen fairly well. She has no irregular bleeding and is amenorrheic. She has occasional hot flashes.  Medications: I have reviewed the patient's current medications.  Allergies:  Allergies  Allergen Reactions  . Aspirin     Past Medical History, Surgical history, Social history, and Family History were reviewed and updated.  Review of Systems: Constitutional:  Negative for fever, chills, night sweats, anorexia, weight loss, pain. Cardiovascular: no chest pain or dyspnea on exertion Respiratory: no cough, shortness of breath, or wheezing Neurological: no TIA or stroke symptoms Dermatological: negative ENT: negative Skin Gastrointestinal: no abdominal pain, change in bowel habits, or black or bloody stools Genito-Urinary: no dysuria, trouble voiding, or hematuria Hematological and Lymphatic: negative Breast: negative for breast lumps Musculoskeletal: negative Remaining ROS negative.  Physical Exam: Blood pressure 121/82, pulse 109, temperature 99.1 F (37.3 C), height 5\' 2"  (1.575 m), weight 120 lb 4.8 oz (54.568 kg). ECOG: 0 General appearance: alert, cooperative and appears stated age Head: Normocephalic, without obvious abnormality, atraumatic Neck: no adenopathy, no carotid bruit, no JVD, supple, symmetrical, trachea midline and thyroid not enlarged, symmetric, no tenderness/mass/nodules Lymph nodes: Cervical, supraclavicular, and axillary nodes normal. Cardiac : regular rate and rhythm, no murmurs or gallops Pulmonary:clear to auscultation bilaterally and normal  percussion bilaterally Breasts: inspection negative, no nipple discharge or bleeding, no masses or nodularity palpable Abdomen:soft, non-tender; bowel sounds normal; no masses,  no organomegaly Extremities negative Neuro: alert, oriented, normal speech, no focal findings or movement disorder noted  Lab Results: Lab Results  Component Value Date   WBC 4.8 04/07/2012   HGB 13.7 04/07/2012   HCT 41.4 04/07/2012   MCV 94.0 04/07/2012   PLT 217 04/07/2012     Chemistry      Component Value Date/Time   NA 141 10/02/2011 0930   K 4.6 10/02/2011 0930   CL 105 10/02/2011 0930   CO2 28 10/02/2011 0930   BUN 17 10/02/2011 0930   CREATININE 0.62 10/02/2011 0930      Component Value Date/Time   CALCIUM 9.6 10/02/2011 0930   ALKPHOS 39 10/02/2011 0930   AST 20 10/02/2011 0930   ALT 19 10/02/2011 0930   BILITOT 0.6 10/02/2011 0930      .pathology. Radiological Studies: chest X-ray n/a Mammogram 3/13- wnl Bone density n/a  Impression and Plan: Connie Miller is doing well. She has ongoing anxiety . She is tolerating tamoxifen , apart from hot flashes . I will see her in 6 months.  More than 50% of the visit was spent in patient-related counselling   Pierce Crane, MD 8/7/201310:19 PM

## 2012-10-07 ENCOUNTER — Encounter: Payer: Self-pay | Admitting: Oncology

## 2012-10-07 ENCOUNTER — Encounter: Payer: Self-pay | Admitting: *Deleted

## 2012-10-07 NOTE — Progress Notes (Signed)
Called and spoke with patient regarding her follow up appt. Offered patient appt. With advanced practice provider, but patient only wants to see Dr. Welton Miller.  Confirmed appt. For 11/10/12 at 10am.

## 2012-10-13 ENCOUNTER — Other Ambulatory Visit: Payer: BC Managed Care – PPO | Admitting: Lab

## 2012-10-13 ENCOUNTER — Ambulatory Visit: Payer: BC Managed Care – PPO | Admitting: Oncology

## 2012-11-10 ENCOUNTER — Ambulatory Visit (HOSPITAL_BASED_OUTPATIENT_CLINIC_OR_DEPARTMENT_OTHER): Payer: PRIVATE HEALTH INSURANCE | Admitting: Oncology

## 2012-11-10 ENCOUNTER — Ambulatory Visit (HOSPITAL_BASED_OUTPATIENT_CLINIC_OR_DEPARTMENT_OTHER): Payer: PRIVATE HEALTH INSURANCE | Admitting: Lab

## 2012-11-10 ENCOUNTER — Encounter: Payer: Self-pay | Admitting: Oncology

## 2012-11-10 ENCOUNTER — Telehealth: Payer: Self-pay | Admitting: Oncology

## 2012-11-10 VITALS — BP 110/66 | HR 89 | Temp 98.0°F | Resp 20 | Ht 62.0 in | Wt 114.5 lb

## 2012-11-10 DIAGNOSIS — D0591 Unspecified type of carcinoma in situ of right breast: Secondary | ICD-10-CM

## 2012-11-10 DIAGNOSIS — D0590 Unspecified type of carcinoma in situ of unspecified breast: Secondary | ICD-10-CM

## 2012-11-10 LAB — COMPREHENSIVE METABOLIC PANEL
ALT: 20 U/L (ref 0–35)
AST: 19 U/L (ref 0–37)
Albumin: 4.3 g/dL (ref 3.5–5.2)
Alkaline Phosphatase: 41 U/L (ref 39–117)
BUN: 11 mg/dL (ref 6–23)
CO2: 29 mEq/L (ref 19–32)
Calcium: 9.6 mg/dL (ref 8.4–10.5)
Chloride: 104 mEq/L (ref 96–112)
Creatinine, Ser: 0.72 mg/dL (ref 0.50–1.10)
Glucose, Bld: 98 mg/dL (ref 70–99)
Potassium: 3.9 mEq/L (ref 3.5–5.3)
Sodium: 141 mEq/L (ref 135–145)
Total Bilirubin: 0.3 mg/dL (ref 0.3–1.2)
Total Protein: 7 g/dL (ref 6.0–8.3)

## 2012-11-10 LAB — CBC WITH DIFFERENTIAL/PLATELET
BASO%: 1.1 % (ref 0.0–2.0)
Basophils Absolute: 0.1 10*3/uL (ref 0.0–0.1)
EOS%: 0.6 % (ref 0.0–7.0)
Eosinophils Absolute: 0 10*3/uL (ref 0.0–0.5)
HCT: 42.1 % (ref 34.8–46.6)
HGB: 13.8 g/dL (ref 11.6–15.9)
LYMPH%: 21.5 % (ref 14.0–49.7)
MCH: 30.8 pg (ref 25.1–34.0)
MCHC: 32.8 g/dL (ref 31.5–36.0)
MCV: 94 fL (ref 79.5–101.0)
MONO#: 0.4 10*3/uL (ref 0.1–0.9)
MONO%: 6.4 % (ref 0.0–14.0)
NEUT#: 4.5 10*3/uL (ref 1.5–6.5)
NEUT%: 70.4 % (ref 38.4–76.8)
Platelets: 248 10*3/uL (ref 145–400)
RBC: 4.48 10*6/uL (ref 3.70–5.45)
RDW: 13.6 % (ref 11.2–14.5)
WBC: 6.4 10*3/uL (ref 3.9–10.3)
lymph#: 1.4 10*3/uL (ref 0.9–3.3)

## 2012-11-10 MED ORDER — TAMOXIFEN CITRATE 20 MG PO TABS: 20.0000 mg | ORAL_TABLET | Freq: Every day | ORAL | Status: DC

## 2012-11-10 NOTE — Patient Instructions (Addendum)
Continue tamoxifen 20 mg daily  I will see you back in 6 months 

## 2012-11-10 NOTE — Progress Notes (Signed)
OFFICE PROGRESS NOTE  CC Connie Fillers, MD [address] [facility], [place]. [place] [address] Dr. Emelia Loron  DIAGNOSIS:57 year old female who underwent a right breast lumpectomy and sentinel node biopsy for stage 0 right breast cancer   PRIOR THERAPY:  #1in March 2012 patient was found to have a mammographic abnormality in the right breast. 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 MRI did not show any other changes. The tumor was ER positive PR positive.  #2 she went on to have a right breast wire guided lumpectomy and right axilla sentinel lymph node biopsy 12/03/2010. The final pathology revealed no evidence of residual DCIS. There was noted to be atypical lobular hyperplasia. All margins were negative. One sentinel node was negative for disease. Tumor was ER +99% PR +100%.  #3 patient then went on to complete radiation therapy and was begun on tamoxifen 20 mg by Dr. Pierce Crane as a chemopreventive. This was started in June 2012   CURRENT THERAPY:tamoxifen 20 mg daily.  INTERVAL HISTORY: SYBIL SHRADER 57 y.o. female returns for ALT visit to my clinic in the absence of Dr. Pierce Crane. Clinically patient seems to be doing well she is tolerating tamoxifen without any problems. She denies any fevers chills night sweats headaches shortness of breath chest pains palpitations no myalgias and arthralgias. Remainder of the template review of systems is negative.  MEDICAL HISTORY: Past Medical History  Diagnosis Date  . Ectopic pregnancy   . Cervical polyp Feb. 2011    benign  . Migraine     1 every 6 weeks. food triggered/hormonal  . Fibroid uterus 2007  . 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 Breast cancer, stage 0     ALLERGIES:  is allergic to aspirin.  MEDICATIONS:  Current Outpatient Prescriptions  Medication Sig Dispense Refill  . aspirin 81 MG tablet Take 81 mg by mouth daily.         . Calcium Carbonate (CALTRATE 600 PO) Take 1,200 Units by mouth daily.        . Multiple Vitamins-Minerals (CENTRUM SILVER PO) Take 1 tablet by mouth daily.        . pravastatin (PRAVACHOL) 20 MG tablet Take 20 mg by mouth daily.      . SUMAtriptan (IMITREX) 100 MG tablet Take 100 mg by mouth as needed.        . tamoxifen (NOLVADEX) 20 MG tablet Take 20 mg by mouth daily.       No current facility-administered medications for this visit.    SURGICAL HISTORY:  Past Surgical History  Procedure Laterality Date  . Ectopic pregnancy surgery  August 1997  . Cervical polypectomy  feb. 2011  . Skin cancer excision  1996    removal of small spot on eyelid, noninvasive (basal cell) skin cancer  . Breast lumpectomy      right breast lumpectomy, snbx    REVIEW OF SYSTEMS:  A comprehensive review of systems was negative.   HEALTH MAINTENANCE:  PHYSICAL EXAMINATION: Blood pressure 110/66, pulse 89, temperature 98 F (36.7 C), temperature source Oral, resp. rate 20, height 5\' 2"  (1.575 m), weight 114 lb 8 oz (51.937 kg). Body mass index is 20.94 kg/(m^2). ECOG PERFORMANCE STATUS: 0 - Asymptomatic   General appearance: alert, cooperative and appears stated age Resp: clear to auscultation bilaterally and normal percussion bilaterally Back: symmetric, no curvature. ROM normal. No CVA tenderness. Cardio: regular rate  and rhythm GI: soft, non-tender; bowel sounds normal; no masses,  no organomegaly Extremities: extremities normal, atraumatic, no cyanosis or edema Neurologic: Grossly normal   LABORATORY DATA: Lab Results  Component Value Date   WBC 4.8 04/07/2012   HGB 13.7 04/07/2012   HCT 41.4 04/07/2012   MCV 94.0 04/07/2012   PLT 217 04/07/2012      Chemistry      Component Value Date/Time   NA 141 10/02/2011 0930   K 4.6 10/02/2011 0930   CL 105 10/02/2011 0930   CO2 28 10/02/2011 0930   BUN 17 10/02/2011 0930   CREATININE 0.62 10/02/2011 0930      Component Value Date/Time    CALCIUM 9.6 10/02/2011 0930   ALKPHOS 39 10/02/2011 0930   AST 20 10/02/2011 0930   ALT 19 10/02/2011 0930   BILITOT 0.6 10/02/2011 0930       RADIOGRAPHIC STUDIES:  No results found.  ASSESSMENT: 57 year old female with  #1 DCIS of the right breast that was ER positive PR positive with associated atypical ductal hyperplasia originally diagnosed in March 2012. Patient is status post lumpectomy with sentinel lymph node biopsy March 2012. Thereafter she underwent radiation therapy by Dr. Amada Jupiter her. She was then placed on tamoxifen 20 mg daily. She's tolerating it well. She has no evidence of recurrent disease.   PLAN:   #1 patient will continue tamoxifen for a total of 5 years.  #2 I will see her back in 6 months time in followup.   All questions were answered. The patient knows to call the clinic with any problems, questions or concerns. We can certainly see the patient much sooner if necessary.  I spent 40 minutes counseling the patient face to face. The total time spent in the appointment was 40 minutes.    Drue Second, MD Medical/Oncology [facility] [phone] (beeper) [phone] (Office)  11/10/2012, 10:10 AM

## 2012-11-10 NOTE — Telephone Encounter (Signed)
Pt sent back to lb and given appt schedule for September.

## 2013-01-15 ENCOUNTER — Ambulatory Visit (INDEPENDENT_AMBULATORY_CARE_PROVIDER_SITE_OTHER): Payer: PRIVATE HEALTH INSURANCE | Admitting: General Surgery

## 2013-01-15 ENCOUNTER — Encounter (INDEPENDENT_AMBULATORY_CARE_PROVIDER_SITE_OTHER): Payer: Self-pay | Admitting: General Surgery

## 2013-01-15 VITALS — BP 102/74 | HR 80 | Resp 18 | Ht 62.0 in | Wt 112.8 lb

## 2013-01-15 DIAGNOSIS — D0590 Unspecified type of carcinoma in situ of unspecified breast: Secondary | ICD-10-CM

## 2013-01-15 DIAGNOSIS — D0591 Unspecified type of carcinoma in situ of right breast: Secondary | ICD-10-CM

## 2013-01-15 NOTE — Progress Notes (Signed)
Subjective:     Patient ID: Connie Miller, female   DOB: 09/04/1956, 56 y.o.   MRN: [id number]  HPI This is a 57 year old female who is now 2 years status post therapy for a stage 0 right breast cancer. She underwent a right breast lumpectomy as well as a sentinel lymph node biopsy. This was done because of the location of her tumor. She has done well since surgery. Her arm is functioning pretty well right now. She has no arm swelling. She is working. She is on her tamoxifen. She is taking that in the morning right now and has some significant fatigue up until about 2:00 in the afternoon and cannot go to sleep at night. She and I discussed possibly taking her tamoxifen later today or in the evening. She also found out unfortunately not in the best way that her oncologist was no longer working at the cancer center and she is now being seen by Dr. Welton Flakes. She has no real complaints referable to either breast except for she can feel where her scar is. She underwent a mammogram in March that as a BI-RADS 2 and recommended for a one year followup.  Review of Systems     Objective:   Physical Exam  Vitals reviewed. Constitutional: She appears well-developed and well-nourished.  Neck: Neck supple.  Pulmonary/Chest: Right breast exhibits no inverted nipple, no mass, no nipple discharge, no skin change and no tenderness. Left breast exhibits no inverted nipple, no mass, no nipple discharge, no skin change and no tenderness. Breasts are symmetrical.    Lymphadenopathy:    She has no cervical adenopathy.    She has no axillary adenopathy.       Right: No supraclavicular adenopathy present.       Left: No supraclavicular adenopathy present.       Assessment:     HIstory stage 0 right breast cancer     Plan:     We had a long talk today about the role of mammograms. She went to talk by an epidemiologist about mammograms and we discussed at length today. I do not find any clinical evidence of  recurrence on her. She is doing around exams. She has a mammogram that is up to date and is negative. We discussed staying healthy, eating well, and continuing to exercise. She will continue monthly self exams. She may try change her medications around to have less side effects as well. I told her I would continue to see her annually and she can call me anytime.

## 2013-01-15 NOTE — Patient Instructions (Signed)

## 2013-03-04 ENCOUNTER — Other Ambulatory Visit: Payer: Self-pay

## 2013-03-04 ENCOUNTER — Ambulatory Visit (INDEPENDENT_AMBULATORY_CARE_PROVIDER_SITE_OTHER): Payer: PRIVATE HEALTH INSURANCE | Admitting: Obstetrics & Gynecology

## 2013-03-04 ENCOUNTER — Encounter: Payer: Self-pay | Admitting: Obstetrics & Gynecology

## 2013-03-04 VITALS — BP 128/83 | HR 72 | Temp 98.3°F | Ht 62.0 in | Wt 120.0 lb

## 2013-03-04 DIAGNOSIS — N904 Leukoplakia of vulva: Secondary | ICD-10-CM

## 2013-03-04 DIAGNOSIS — Z01419 Encounter for gynecological examination (general) (routine) without abnormal findings: Secondary | ICD-10-CM | POA: Insufficient documentation

## 2013-03-04 DIAGNOSIS — Z0000 Encounter for general adult medical examination without abnormal findings: Secondary | ICD-10-CM

## 2013-03-04 NOTE — Patient Instructions (Addendum)
Vulva Biopsy Care After These instructions give you information on caring for yourself after your procedure. Your doctor may also give you more specific instructions. Call your doctor if you have any problems or questions after your procedure. HOME CARE   Only take medicine as told by your doctor.  Do not rub the biopsy area after you pee (urinate). Gently pat the area dry. You can use a bottle filled with warm water (peri-bottle) to clean the area. Gently wipe from front to back.  Clean the area using water and mild soap. Gently pat the area dry.  Check the biopsy area every day using a mirror. Make sure it is healing.  Do not have sex (intercourse) for 3 4 days or as told by your doctor.  Avoid exercise (running, biking) for 2 3 days or as told by your doctor.  You may shower after the procedure. Avoid bathing and swimming until the stitches (sutures) dissolve and healing is complete.  Wear loose cotton underwear. Do not wear tight pants.  Keep all follow-up visits with your doctor.  Call your doctor to get your test results. GET HELP RIGHT AWAY IF:   You have pain that gets worse and is not helped by pain medicine.  Your vaginal area becomes puffy (swollen) or red.  You have fluid or an abnormally bad smell coming from your vagina.  You have a fever. MAKE SURE YOU:  Understand these instructions.  Will watch your condition.  Will get help right away if you are not doing well or get worse. Document Released: 11/22/2008 Document Revised: 08/12/2012 Document Reviewed: 06/22/2012 Exit[employer] Patient Information 2014 [employer].  

## 2013-03-04 NOTE — Progress Notes (Signed)
.   Subjective:     Connie Miller is a 57 y.o. female here for a routine exam.  No current complaints.  Personal health questionnaire reviewed: no.   Gynecologic History No LMP recorded. Patient is postmenopausal. Contraception: none Last Pap: 12/2011. Results were:normal Last mammogram: 11/2012. Results were: normal  Obstetric History OB History   Grav Para Term Preterm Abortions TAB SAB Ect Mult Living                   The following portions of the patient's history were reviewed and updated as appropriate: allergies, current medications, past family history, past medical history, past social history, past surgical history and problem list.  Review of Systems Pertinent items are noted in HPI.    Objective:    General appearance: alert  Abdomen: soft, non-tender; bowel sounds normal; no masses,  no organomegaly Pelvic: cervix normal not seen--obliterating scar tissue at vaginal apex--palpated, perianal white patch, no adnexal masses or tenderness, uterus normal size, shape, and consistency and vagina normal without discharge   The perianal skin was prepped w/betadine.  A written consent was obtained for a biopsy.  The skin was infiltrated with 1% lidocaine plain.  A punch biopsy was taken. Silver nitrate was applied. An informal U/S showed an endometrial lining measuring 0.68 cm.  Assessment:    ?Hyperplastic perianal skin Slightly thickened endometrial lining in the setting of Tamoxifen--denies bleeding  Plan:    Topical low potency steroids for now Check skin  Biopsy result Return prn

## 2013-03-05 LAB — PAP IG W/ RFLX HPV ASCU

## 2013-05-20 ENCOUNTER — Ambulatory Visit (HOSPITAL_BASED_OUTPATIENT_CLINIC_OR_DEPARTMENT_OTHER): Payer: PRIVATE HEALTH INSURANCE | Admitting: Oncology

## 2013-05-20 ENCOUNTER — Other Ambulatory Visit (HOSPITAL_BASED_OUTPATIENT_CLINIC_OR_DEPARTMENT_OTHER): Payer: PRIVATE HEALTH INSURANCE | Admitting: Lab

## 2013-05-20 ENCOUNTER — Telehealth: Payer: Self-pay | Admitting: Oncology

## 2013-05-20 ENCOUNTER — Encounter: Payer: Self-pay | Admitting: Oncology

## 2013-05-20 VITALS — BP 108/67 | HR 70 | Temp 98.2°F | Resp 20 | Ht 62.0 in | Wt 112.5 lb

## 2013-05-20 DIAGNOSIS — D0591 Unspecified type of carcinoma in situ of right breast: Secondary | ICD-10-CM

## 2013-05-20 DIAGNOSIS — D0590 Unspecified type of carcinoma in situ of unspecified breast: Secondary | ICD-10-CM

## 2013-05-20 LAB — CBC WITH DIFFERENTIAL/PLATELET
BASO%: 0.9 % (ref 0.0–2.0)
Basophils Absolute: 0.1 10*3/uL (ref 0.0–0.1)
EOS%: 1.1 % (ref 0.0–7.0)
Eosinophils Absolute: 0.1 10*3/uL (ref 0.0–0.5)
HCT: 39.3 % (ref 34.8–46.6)
HGB: 13.1 g/dL (ref 11.6–15.9)
LYMPH%: 30.2 % (ref 14.0–49.7)
MCH: 30.9 pg (ref 25.1–34.0)
MCHC: 33.2 g/dL (ref 31.5–36.0)
MCV: 93 fL (ref 79.5–101.0)
MONO#: 0.5 10*3/uL (ref 0.1–0.9)
MONO%: 8.9 % (ref 0.0–14.0)
NEUT#: 3.4 10*3/uL (ref 1.5–6.5)
NEUT%: 58.9 % (ref 38.4–76.8)
Platelets: 205 10*3/uL (ref 145–400)
RBC: 4.22 10*6/uL (ref 3.70–5.45)
RDW: 13.3 % (ref 11.2–14.5)
WBC: 5.7 10*3/uL (ref 3.9–10.3)
lymph#: 1.7 10*3/uL (ref 0.9–3.3)

## 2013-05-20 LAB — COMPREHENSIVE METABOLIC PANEL (CC13)
ALT: 20 U/L (ref 0–55)
AST: 20 U/L (ref 5–34)
Albumin: 3.7 g/dL (ref 3.5–5.0)
Alkaline Phosphatase: 38 U/L — ABNORMAL LOW (ref 40–150)
BUN: 19.8 mg/dL (ref 7.0–26.0)
CO2: 28 mEq/L (ref 22–29)
Calcium: 9.2 mg/dL (ref 8.4–10.4)
Chloride: 107 mEq/L (ref 98–109)
Creatinine: 0.7 mg/dL (ref 0.6–1.1)
Glucose: 94 mg/dl (ref 70–140)
Potassium: 4 mEq/L (ref 3.5–5.1)
Sodium: 143 mEq/L (ref 136–145)
Total Bilirubin: 0.34 mg/dL (ref 0.20–1.20)
Total Protein: 6.7 g/dL (ref 6.4–8.3)

## 2013-05-20 NOTE — Patient Instructions (Addendum)
Continue tamoxifen 20 mg daily  I will see you back in 6 months

## 2013-05-20 NOTE — Progress Notes (Signed)
OFFICE PROGRESS NOTE  CC Garlan Fillers, MD [address] [facility], [place]. [place] [address] Dr. Emelia Loron  DIAGNOSIS:57 year old female who underwent a right breast lumpectomy and sentinel node biopsy for stage 0 right breast cancer   PRIOR THERAPY:  #1in March 2012 patient was found to have a mammographic abnormality in the right breast. 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 MRI did not show any other changes. The tumor was ER positive PR positive.  #2 she went on to have a right breast wire guided lumpectomy and right axilla sentinel lymph node biopsy 12/03/2010. The final pathology revealed no evidence of residual DCIS. There was noted to be atypical lobular hyperplasia. All margins were negative. One sentinel node was negative for disease. Tumor was ER +99% PR +100%.  #3 patient then went on to complete radiation therapy and was begun on tamoxifen 20 mg by Dr. Pierce Crane as a chemopreventive. This was started in June 2012   CURRENT THERAPY:tamoxifen 20 mg daily.  INTERVAL HISTORY: Connie Miller 57 y.o. female returns for ALT visit to my clinic in the absence of Dr. Pierce Crane. Clinically patient seems to be doing well she is tolerating tamoxifen without any problems. She denies any fevers chills night sweats headaches shortness of breath chest pains palpitations no myalgias and arthralgias. Remainder of the template review of systems is negative.  MEDICAL HISTORY: Past Medical History  Diagnosis Date  . Ectopic pregnancy   . Cervical polyp Feb. 2011    benign  . Migraine     1 every 6 weeks. food triggered/hormonal  . Fibroid uterus 2007  . 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 Breast cancer, stage 0     ALLERGIES:  has No Known Allergies.  MEDICATIONS:  Current Outpatient Prescriptions  Medication Sig Dispense Refill  . aspirin 81 MG tablet Take 81 mg by mouth daily.         . Calcium Carbonate (CALTRATE 600 PO) Take 1,200 Units by mouth daily.        . Multiple Vitamins-Minerals (CENTRUM SILVER PO) Take 1 tablet by mouth daily.        . pravastatin (PRAVACHOL) 20 MG tablet Take 20 mg by mouth daily.      . tamoxifen (NOLVADEX) 20 MG tablet Take 1 tablet (20 mg total) by mouth daily.  90 tablet  12  . SUMAtriptan (IMITREX) 100 MG tablet Take 100 mg by mouth as needed.         No current facility-administered medications for this visit.    SURGICAL HISTORY:  Past Surgical History  Procedure Laterality Date  . Ectopic pregnancy surgery  August 1997  . Cervical polypectomy  feb. 2011  . Skin cancer excision  1996    removal of small spot on eyelid, noninvasive (basal cell) skin cancer  . Breast lumpectomy      right breast lumpectomy, snbx    REVIEW OF SYSTEMS:  A comprehensive review of systems was negative.   HEALTH MAINTENANCE:  PHYSICAL EXAMINATION: Blood pressure 108/67, pulse 70, temperature 98.2 F (36.8 C), temperature source Oral, resp. rate 20, height 5\' 2"  (1.575 m), weight 112 lb 8 oz (51.03 kg). Body mass index is 20.57 kg/(m^2). ECOG PERFORMANCE STATUS: 0 - Asymptomatic   General appearance: alert, cooperative and appears stated age Resp: clear to auscultation bilaterally and normal percussion bilaterally Back: symmetric, no curvature. ROM normal. No CVA  tenderness. Cardio: regular rate and rhythm GI: soft, non-tender; bowel sounds normal; no masses,  no organomegaly Extremities: extremities normal, atraumatic, no cyanosis or edema Neurologic: Grossly normal   LABORATORY DATA: Lab Results  Component Value Date   WBC 5.7 05/20/2013   HGB 13.1 05/20/2013   HCT 39.3 05/20/2013   MCV 93.0 05/20/2013   PLT 205 05/20/2013      Chemistry      Component Value Date/Time   NA 143 05/20/2013 1003   NA 141 11/10/2012 1318   K 4.0 05/20/2013 1003   K 3.9 11/10/2012 1318   CL 104 11/10/2012 1318   CO2 28 05/20/2013 1003   CO2 29 11/10/2012 1318    BUN 19.8 05/20/2013 1003   BUN 11 11/10/2012 1318   CREATININE 0.7 05/20/2013 1003   CREATININE 0.72 11/10/2012 1318      Component Value Date/Time   CALCIUM 9.2 05/20/2013 1003   CALCIUM 9.6 11/10/2012 1318   ALKPHOS 38* 05/20/2013 1003   ALKPHOS 41 11/10/2012 1318   AST 20 05/20/2013 1003   AST 19 11/10/2012 1318   ALT 20 05/20/2013 1003   ALT 20 11/10/2012 1318   BILITOT 0.34 05/20/2013 1003   BILITOT 0.3 11/10/2012 1318       RADIOGRAPHIC STUDIES:  No results found.  ASSESSMENT: 57 year old female with  #1 DCIS of the right breast that was ER positive PR positive with associated atypical ductal hyperplasia originally diagnosed in March 2012. Patient is status post lumpectomy with sentinel lymph node biopsy March 2012. Thereafter she underwent radiation therapy by Dr. Amada Jupiter her. She was then placed on tamoxifen 20 mg daily. She's tolerating it well. She has no evidence of recurrent disease.   PLAN:   #1 patient will continue tamoxifen for a total of 5 years.  #2 I will see her back in 6 months time in followup.   All questions were answered. The patient knows to call the clinic with any problems, questions or concerns. We can certainly see the patient much sooner if necessary.  I spent 20 minutes counseling the patient face to face. The total time spent in the appointment was 25 minutes.    Drue Second, MD Medical/Oncology [facility] [phone] (beeper) [phone] (Office)  05/20/2013, 11:50 AM

## 2013-10-28 ENCOUNTER — Encounter: Payer: Self-pay | Admitting: Obstetrics & Gynecology

## 2013-10-28 ENCOUNTER — Other Ambulatory Visit: Payer: Self-pay | Admitting: *Deleted

## 2013-10-28 ENCOUNTER — Ambulatory Visit (INDEPENDENT_AMBULATORY_CARE_PROVIDER_SITE_OTHER): Payer: PRIVATE HEALTH INSURANCE | Admitting: Obstetrics & Gynecology

## 2013-10-28 VITALS — BP 130/82 | HR 84 | Temp 98.7°F | Ht 62.0 in | Wt 110.0 lb

## 2013-10-28 DIAGNOSIS — N950 Postmenopausal bleeding: Secondary | ICD-10-CM

## 2013-10-28 NOTE — Progress Notes (Signed)
Subjective:     Connie Miller is a 58 y.o. female here for a problem visit.  Current complaints: Patient states she is having brown staining with a mucous discharge. Pt states it is similar to what she saw when she was ovulating. Personal health questionnaire reviewed: yes.   Gynecologic History No LMP recorded. Patient is postmenopausal. Contraception: none Last Pap: 02/2013. Results were: normal Last mammogram: 11/2012. Results were: abnormal No lumps secected  Obstetric History OB History  Gravida Para Term Preterm AB SAB TAB Ectopic Multiple Living  3 0 0 0 3 2 0 1 0 0     # Outcome Date GA Lbr Len/2nd Weight Sex Delivery Anes PTL Lv  3 ECT 1999          2 SAB 1998          1 SAB 1997               The following portions of the patient's history were reviewed and updated as appropriate: allergies, current medications, past family history, past medical history, past social history, past surgical history and problem list.  Review of Systems Pertinent items are noted in HPI.    Objective:    Pelvic: synechiae obliterating vaginal apex; channel at left fornix; scant, brown heme    Assessment:    Postmenopausal bleeding on Tamoxifen   Plan:    Pelvic ultrasound    Likely candidate for EUA Return after the U/S

## 2013-10-29 NOTE — Patient Instructions (Signed)
Postmenopausal Bleeding Postmenopausal bleeding is any bleeding a woman has after she has entered into menopause. Menopause is the end of a woman's fertile years. After menopause, a woman no longer ovulates or has menstrual periods.  Postmenopausal bleeding can be caused by various things. Any type of postmenopausal bleeding, even if it appears to be a typical menstrual period, is concerning. This should be evaluated by your health care provider. Any treatment will depend on the cause of the bleeding. HOME CARE INSTRUCTIONS Monitor your condition for any changes. The following actions may help to alleviate any discomfort you are experiencing:  Avoid the use of tampons and douches as directed by your health care provider.  Change your pads frequently.  Get regular pelvic exams and Pap tests.  Keep all follow-up appointments for diagnostic tests as directed by your health care provider. SEEK MEDICAL CARE IF:   Your bleeding lasts more than 1 week.  You have abdominal pain.  You have bleeding with sexual intercourse. SEEK IMMEDIATE MEDICAL CARE IF:   You have a fever, chills, headache, dizziness, muscle aches, and bleeding.  You have severe pain with bleeding.  You are passing blood clots.  You have bleeding and need more than 1 pad an hour.  You feel faint. MAKE SURE YOU:  Understand these instructions.  Will watch your condition.  Will get help right away if you are not doing well or get worse. Document Released: 12/04/2005 Document Revised: 06/16/2013 Document Reviewed: 03/25/2013 [employer] Patient Information 2014 [employer].  

## 2013-11-01 ENCOUNTER — Encounter: Payer: Self-pay | Admitting: Obstetrics & Gynecology

## 2013-11-01 ENCOUNTER — Ambulatory Visit (HOSPITAL_COMMUNITY)
Admission: RE | Admit: 2013-11-01 | Discharge: 2013-11-01 | Disposition: A | Discharge status: Home / Self Care | Payer: PRIVATE HEALTH INSURANCE | Source: Ambulatory Visit | Attending: Obstetrics & Gynecology | Admitting: Obstetrics & Gynecology

## 2013-11-01 ENCOUNTER — Ambulatory Visit (INDEPENDENT_AMBULATORY_CARE_PROVIDER_SITE_OTHER): Payer: PRIVATE HEALTH INSURANCE | Admitting: Obstetrics & Gynecology

## 2013-11-01 VITALS — BP 97/67 | HR 87 | Temp 98.6°F | Ht 62.0 in | Wt 113.0 lb

## 2013-11-01 DIAGNOSIS — N950 Postmenopausal bleeding: Secondary | ICD-10-CM

## 2013-11-01 DIAGNOSIS — Z853 Personal history of malignant neoplasm of breast: Secondary | ICD-10-CM | POA: Insufficient documentation

## 2013-11-01 DIAGNOSIS — N841 Polyp of cervix uteri: Secondary | ICD-10-CM

## 2013-11-01 NOTE — Progress Notes (Signed)
Subjective:     Connie Miller is a 58 y.o. female here for a routine exam.  Current complaints: Patient in office today for follow up of ultrasound results. Patient denies any concerns.   Personal health questionnaire reviewed: yes.   Gynecologic History No LMP recorded. Patient is postmenopausal. Contraception: none Last mammogram: 2014. Results were: abnormal: No new Lumps.   Obstetric History OB History  Gravida Para Term Preterm AB SAB TAB Ectopic Multiple Living  3 0 0 0 3 2 0 1 0 0     # Outcome Date GA Lbr Len/2nd Weight Sex Delivery Anes PTL Lv  3 ECT 1999          2 SAB 1998          1 SAB 1997               The following portions of the patient's history were reviewed and updated as appropriate: allergies, current medications, past family history, past medical history, past social history, past surgical history and problem list.  Review of Systems Pertinent items are noted in HPI.    Objective:     No exam today     Assessment:   ?Endocervical polyp/vs cervical stenosis/synechiae Plan:    Polypectomy/D&C/hysteroscopy w/ultrasound guidance

## 2013-11-02 DIAGNOSIS — N841 Polyp of cervix uteri: Secondary | ICD-10-CM | POA: Insufficient documentation

## 2013-11-02 NOTE — Patient Instructions (Signed)
Hysteroscopy °Hysteroscopy is a procedure used for looking inside the womb (uterus). It may be done for various reasons, including: °· To evaluate abnormal bleeding, fibroid (benign, noncancerous) tumors, polyps, scar tissue (adhesions), and possibly cancer of the uterus. °· To look for lumps (tumors) and other uterine growths. °· To look for causes of why a woman cannot get pregnant (infertility), causes of recurrent loss of pregnancy (miscarriages), or a lost intrauterine device (IUD). °· To perform a sterilization by blocking the fallopian tubes from inside the uterus. °In this procedure, a thin, flexible tube with a tiny light and camera on the end of it (hysteroscope) is used to look inside the uterus. A hysteroscopy should be done right after a menstrual period to be sure you are not pregnant. °LET YOUR HEALTH CARE PROVIDER KNOW ABOUT:  °· Any allergies you have. °· All medicines you are taking, including vitamins, herbs, eye drops, creams, and over-the-counter medicines. °· Previous problems you or members of your family have had with the use of anesthetics. °· Any blood disorders you have. °· Previous surgeries you have had. °· Medical conditions you have. °RISKS AND COMPLICATIONS  °Generally, this is a safe procedure. However, as with any procedure, complications can occur. Possible complications include: °· Putting a hole in the uterus. °· Excessive bleeding. °· Infection. °· Damage to the cervix. °· Injury to other organs. °· Allergic reaction to medicines. °· Too much fluid used in the uterus for the procedure. °BEFORE THE PROCEDURE  °· Ask your health care provider about changing or stopping any regular medicines. °· Do not take aspirin or blood thinners for 1 week before the procedure, or as directed by your health care provider. These can cause bleeding. °· If you smoke, do not smoke for 2 weeks before the procedure. °· In some cases, a medicine is placed in the cervix the day before the procedure.  This medicine makes the cervix have a larger opening (dilate). This makes it easier for the instrument to be inserted into the uterus during the procedure. °· Do not eat or drink anything for at least 8 hours before the surgery. °· Arrange for someone to take you home after the procedure. °PROCEDURE  °· You may be given a medicine to relax you (sedative). You may also be given one of the following: °· A medicine that numbs the area around the cervix (local anesthetic). °· A medicine that makes you sleep through the procedure (general anesthetic). °· The hysteroscope is inserted through the vagina into the uterus. The camera on the hysteroscope sends a picture to a TV screen. This gives the surgeon a good view inside the uterus. °· During the procedure, air or a liquid is put into the uterus, which allows the surgeon to see better. °· Sometimes, tissue is gently scraped from inside the uterus. These tissue samples are sent to a lab for testing. °AFTER THE PROCEDURE  °· If you had a general anesthetic, you may be groggy for a couple hours after the procedure. °· If you had a local anesthetic, you will be able to go home as soon as you are stable and feel ready. °· You may have some cramping. This normally lasts for a couple days. °· You may have bleeding, which varies from light spotting for a few days to menstrual-like bleeding for 3 7 days. This is normal. °· If your test results are not back during the visit, make an appointment with your health care provider to find out   the results. °Document Released: 12/02/2000 Document Revised: 06/16/2013 Document Reviewed: 03/25/2013 °[employer]® Patient Information ©2014 [employer]. ° °

## 2013-11-03 ENCOUNTER — Encounter: Payer: Self-pay | Admitting: Obstetrics & Gynecology

## 2013-11-03 ENCOUNTER — Other Ambulatory Visit: Payer: Self-pay | Admitting: *Deleted

## 2013-11-03 ENCOUNTER — Encounter (HOSPITAL_COMMUNITY): Payer: Self-pay | Admitting: Pharmacist

## 2013-11-05 ENCOUNTER — Ambulatory Visit (HOSPITAL_COMMUNITY)
Admission: RE | Admit: 2013-11-05 | Discharge: 2013-11-05 | Disposition: A | Discharge status: Home / Self Care | Payer: PRIVATE HEALTH INSURANCE | Source: Ambulatory Visit | Attending: Obstetrics & Gynecology | Admitting: Obstetrics & Gynecology

## 2013-11-05 ENCOUNTER — Encounter (HOSPITAL_COMMUNITY): Payer: PRIVATE HEALTH INSURANCE | Admitting: Anesthesiology

## 2013-11-05 ENCOUNTER — Ambulatory Visit (HOSPITAL_COMMUNITY): Payer: PRIVATE HEALTH INSURANCE

## 2013-11-05 ENCOUNTER — Encounter (HOSPITAL_COMMUNITY)
Admission: RE | Disposition: A | Discharge status: Home / Self Care | Payer: Self-pay | Source: Ambulatory Visit | Attending: Obstetrics & Gynecology

## 2013-11-05 ENCOUNTER — Ambulatory Visit (HOSPITAL_COMMUNITY): Payer: PRIVATE HEALTH INSURANCE | Admitting: Anesthesiology

## 2013-11-05 ENCOUNTER — Encounter (HOSPITAL_COMMUNITY): Payer: Self-pay

## 2013-11-05 DIAGNOSIS — N950 Postmenopausal bleeding: Secondary | ICD-10-CM

## 2013-11-05 DIAGNOSIS — N857 Hematometra: Secondary | ICD-10-CM | POA: Insufficient documentation

## 2013-11-05 DIAGNOSIS — N882 Stricture and stenosis of cervix uteri: Secondary | ICD-10-CM | POA: Insufficient documentation

## 2013-11-05 HISTORY — PX: HYSTEROSCOPY WITH D & C: SHX1775

## 2013-11-05 LAB — CBC
HCT: 41.4 % (ref 36.0–46.0)
Hemoglobin: 13.9 g/dL (ref 12.0–15.0)
MCH: 31.2 pg (ref 26.0–34.0)
MCHC: 33.6 g/dL (ref 30.0–36.0)
MCV: 92.8 fL (ref 78.0–100.0)
Platelets: 246 10*3/uL (ref 150–400)
RBC: 4.46 MIL/uL (ref 3.87–5.11)
RDW: 13.5 % (ref 11.5–15.5)
WBC: 5.6 10*3/uL (ref 4.0–10.5)

## 2013-11-05 SURGERY — DILATATION AND CURETTAGE /HYSTEROSCOPY
Anesthesia: General | Site: Vagina

## 2013-11-05 MED ORDER — KETOROLAC TROMETHAMINE 30 MG/ML IJ SOLN
INTRAMUSCULAR | Status: DC | PRN
  Administered 2013-11-05: 30 mg via INTRAVENOUS

## 2013-11-05 MED ORDER — LIDOCAINE HCL 1 % IJ SOLN
INTRAMUSCULAR | Status: AC
  Filled 2013-11-05: qty 20

## 2013-11-05 MED ORDER — LACTATED RINGERS IV SOLN
INTRAVENOUS | Status: DC
  Administered 2013-11-05 (×2): via INTRAVENOUS

## 2013-11-05 MED ORDER — SODIUM CHLORIDE 0.9 % IV SOLN: 250.0000 mL | INTRAVENOUS | Status: DC | PRN

## 2013-11-05 MED ORDER — LIDOCAINE HCL (CARDIAC) 20 MG/ML IV SOLN
INTRAVENOUS | Status: DC | PRN
  Administered 2013-11-05: 50 mg via INTRAVENOUS

## 2013-11-05 MED ORDER — PROPOFOL 10 MG/ML IV EMUL
INTRAVENOUS | Status: AC
  Filled 2013-11-05: qty 20

## 2013-11-05 MED ORDER — ACETAMINOPHEN 650 MG RE SUPP
650.0000 mg | RECTAL | Status: DC | PRN
  Filled 2013-11-05: qty 1

## 2013-11-05 MED ORDER — MIDAZOLAM HCL 2 MG/2ML IJ SOLN
INTRAMUSCULAR | Status: AC
  Filled 2013-11-05: qty 2

## 2013-11-05 MED ORDER — DEXAMETHASONE SODIUM PHOSPHATE 10 MG/ML IJ SOLN
INTRAMUSCULAR | Status: DC | PRN
  Administered 2013-11-05: 10 mg via INTRAVENOUS

## 2013-11-05 MED ORDER — FENTANYL CITRATE 0.05 MG/ML IJ SOLN
INTRAMUSCULAR | Status: DC | PRN
  Administered 2013-11-05 (×2): 25 ug via INTRAVENOUS
  Administered 2013-11-05: 50 ug via INTRAVENOUS

## 2013-11-05 MED ORDER — DEXAMETHASONE SODIUM PHOSPHATE 10 MG/ML IJ SOLN
INTRAMUSCULAR | Status: AC
  Filled 2013-11-05: qty 1

## 2013-11-05 MED ORDER — ONDANSETRON HCL 4 MG/2ML IJ SOLN
4.0000 mg | Freq: Four times a day (QID) | INTRAMUSCULAR | Status: DC | PRN
Start: 2013-11-05 — End: 2013-11-05

## 2013-11-05 MED ORDER — PROPOFOL 10 MG/ML IV BOLUS
INTRAVENOUS | Status: DC | PRN
  Administered 2013-11-05: 160 mg via INTRAVENOUS

## 2013-11-05 MED ORDER — ONDANSETRON HCL 4 MG/2ML IJ SOLN
INTRAMUSCULAR | Status: DC | PRN
  Administered 2013-11-05: 4 mg via INTRAVENOUS

## 2013-11-05 MED ORDER — ACETAMINOPHEN 325 MG PO TABS: 650.0000 mg | ORAL_TABLET | ORAL | Status: DC | PRN

## 2013-11-05 MED ORDER — KETOROLAC TROMETHAMINE 30 MG/ML IJ SOLN
INTRAMUSCULAR | Status: AC
  Filled 2013-11-05: qty 1

## 2013-11-05 MED ORDER — LIDOCAINE HCL (CARDIAC) 20 MG/ML IV SOLN
INTRAVENOUS | Status: AC
  Filled 2013-11-05: qty 5

## 2013-11-05 MED ORDER — OXYCODONE-ACETAMINOPHEN 5-325 MG PO TABS
2.0000 | ORAL_TABLET | Freq: Four times a day (QID) | ORAL | Status: DC | PRN
Start: 2013-11-05 — End: 2013-11-17

## 2013-11-05 MED ORDER — SODIUM CHLORIDE 0.9 % IJ SOLN: 3.0000 mL | INTRAMUSCULAR | Status: DC | PRN

## 2013-11-05 MED ORDER — OXYCODONE HCL 5 MG PO TABS: 5.0000 mg | ORAL_TABLET | ORAL | Status: DC | PRN

## 2013-11-05 MED ORDER — FENTANYL CITRATE 0.05 MG/ML IJ SOLN
INTRAMUSCULAR | Status: AC
  Filled 2013-11-05: qty 2

## 2013-11-05 MED ORDER — LIDOCAINE HCL 1 % IJ SOLN
INTRAMUSCULAR | Status: DC | PRN
  Administered 2013-11-05: 6 mL

## 2013-11-05 MED ORDER — ONDANSETRON HCL 4 MG/2ML IJ SOLN
INTRAMUSCULAR | Status: AC
  Filled 2013-11-05: qty 2

## 2013-11-05 MED ORDER — FENTANYL CITRATE 0.05 MG/ML IJ SOLN: 25.0000 ug | INTRAMUSCULAR | Status: DC | PRN

## 2013-11-05 MED ORDER — SODIUM CHLORIDE 0.9 % IJ SOLN: 3.0000 mL | Freq: Two times a day (BID) | INTRAMUSCULAR | Status: DC

## 2013-11-05 MED ORDER — MIDAZOLAM HCL 5 MG/5ML IJ SOLN
INTRAMUSCULAR | Status: DC | PRN
  Administered 2013-11-05: 2 mg via INTRAVENOUS

## 2013-11-05 SURGICAL SUPPLY — 24 items
CANISTER SUCT 3000ML (MISCELLANEOUS) ×2 IMPLANT
CATH ROBINSON RED A/P 16FR (CATHETERS) ×2 IMPLANT
CLOTH BEACON ORANGE TIMEOUT ST (SAFETY) ×2 IMPLANT
CONTAINER PREFILL 10% NBF 60ML (FORM) ×4 IMPLANT
DILATOR CANAL MILEX (MISCELLANEOUS) ×1 IMPLANT
DRAPE HYSTEROSCOPY (DRAPE) ×2 IMPLANT
DRSG TELFA 3X8 NADH (GAUZE/BANDAGES/DRESSINGS) ×2 IMPLANT
ELECT REM PT RETURN 9FT ADLT (ELECTROSURGICAL)
ELECTRODE REM PT RTRN 9FT ADLT (ELECTROSURGICAL) IMPLANT
GLOVE BIO SURGEON STRL SZ 6.5 (GLOVE) ×2 IMPLANT
GOWN STRL REUS W/TWL LRG LVL3 (GOWN DISPOSABLE) ×4 IMPLANT
NDL SPNL 20GX3.5 QUINCKE YW (NEEDLE) IMPLANT
NDL SPNL 22GX3.5 QUINCKE BK (NEEDLE) ×1 IMPLANT
NEEDLE SPNL 20GX3.5 QUINCKE YW (NEEDLE) IMPLANT
NEEDLE SPNL 22GX3.5 QUINCKE BK (NEEDLE) ×2 IMPLANT
PACK VAGINAL MINOR WOMEN LF (CUSTOM PROCEDURE TRAY) ×2 IMPLANT
PAD DRESSING TELFA 3X8 NADH (GAUZE/BANDAGES/DRESSINGS) ×1 IMPLANT
PAD OB MATERNITY 4.3X12.25 (PERSONAL CARE ITEMS) ×2 IMPLANT
SCRUB PCMX 4 OZ (MISCELLANEOUS) ×2 IMPLANT
SET TUBING HYSTEROSCOPY 2 NDL (TUBING) ×1 IMPLANT
SYR CONTROL 10ML LL (SYRINGE) ×2 IMPLANT
TOWEL OR 17X24 6PK STRL BLUE (TOWEL DISPOSABLE) ×4 IMPLANT
TUBE HYSTEROSCOPY W Y-CONNECT (TUBING) ×1 IMPLANT
WATER STERILE IRR 1000ML POUR (IV SOLUTION) ×2 IMPLANT

## 2013-11-05 NOTE — Anesthesia Postprocedure Evaluation (Signed)
  Anesthesia Post-op Note  Patient: Connie Miller  Procedure(s) Performed: Procedure(s) with comments: DILATATION AND CURETTAGE  (N/A) -  with Ultrasound guidance  Patient Location: PACU  Anesthesia Type:General  Level of Consciousness: awake, alert  and oriented  Airway and Oxygen Therapy: Patient Spontanous Breathing  Post-op Pain: mild  Post-op Assessment: Post-op Vital signs reviewed, Patient's Cardiovascular Status Stable, Respiratory Function Stable, Patent Airway, No signs of Nausea or vomiting and Pain level controlled  Post-op Vital Signs: Reviewed and stable  Complications: No apparent anesthesia complications

## 2013-11-05 NOTE — Op Note (Signed)
Preoperative diagnosis: post menopausal bleeding  Postoperative diagnosis: post menopausal bleeding, cervical stenosis, hematocolpos, hematometra  Procedure: Diagnostic dilatation and curettage with ultrasound guidance  Surgeon: [name] A  Anesthesia: Laryngeal mask airway, paracervical block  Estimated blood loss: Minimal  Urine output: 100 ml clear urine at the close of the procedure  IV Fluids: per Anesthesiology  Complications: None  Specimen: PATHOLOGY  Operative Findings: There was dense scarring at the vaginal apex. The cervix was not visualized.  There was dark, blood-tinged mucous that drained.  There were no discrete masses.    Description of procedure:   The patient was taken to the operating room and placed on the operating table in the semi-lithotomy position in [place].  Examination under anesthesia was performed.  The patient was prepped and draped in the usual manner.  After a time-out had been completed, a weighted speculum was placed in the vagina.  The vaginal mucosa overlying  the cervix was grasped with a single-toothed tenaculum.    5 cc of 1% lidocaine were injected  to produce a paracervical block.  The vaginal mucosa overlying the cervix was incised with a scalpel.  The uterine cavity sounded to 7 cm.  The endocervical canal was dilated with Kennon Rounds dilators.  The findings are noted above.   The endocervical canal and endometrial cavity were curetted with a small Sims curette.  A gritty texture was noted.  All the instruments were removed from the vagina.  Final instrument counts were correct.  The patient was taken to the PACU in stable condition.

## 2013-11-05 NOTE — Discharge Instructions (Signed)
Dilation and Curettage  Care After Refer to this sheet in the next few weeks. These instructions provide you with information on caring for yourself after your procedure. Your health care provider may also give you more specific instructions. Your treatment has been planned according to current medical practices, but problems sometimes occur. Call your health care provider if you have any problems or questions after your procedure. WHAT TO EXPECT AFTER THE PROCEDURE After your procedure, it is typical to have light cramping and bleeding. This may last for 2 days to 2 weeks after the procedure. HOME CARE INSTRUCTIONS   Do not drive for 24 hours.  Wait 1 week before returning to strenuous activities.  Take your temperature 2 times a day for 4 days and write it down. Provide these temperatures to your health care provider if you develop a fever.  Avoid long periods of standing.  Avoid heavy lifting, pushing, or pulling. Do not lift anything heavier than 10 pounds (4.5 kg).  Limit stair climbing to once or twice a day.  Take rest periods often.  You may resume your usual diet.  Drink enough fluids to keep your urine clear or pale yellow.  Your usual bowel function should return. If you have constipation, you may:  Take a mild laxative with permission from your health care provider.  Add fruit and bran to your diet.  Drink more fluids.  Take showers instead of baths until your health care provider gives you permission to take baths.  Do not go swimming or use a hot tub until your health care provider approves.  Try to have someone with you or available to you the first 24 48 hours, especially if you were given a general anesthetic.  Do not douche, use tampons, or have intercourse for 2 weeks after the procedure.  Only take over-the-counter or prescription medicines as directed by your health care provider. Do not take aspirin. It can cause bleeding.  Follow up with your health  care provider as directed. SEEK MEDICAL CARE IF:   You have increasing cramps or pain that is not relieved with medicine.  You have abdominal pain that does not seem to be related to the same area of earlier cramping and pain.  You have bad smelling vaginal discharge.  You have a rash.  You are having problems with any medicine. SEEK IMMEDIATE MEDICAL CARE IF:   You have bleeding that is heavier than a normal menstrual period.  You have a fever.  You have chest pain.  You have shortness of breath.  You feel dizzy or feel like fainting.  You pass out.  You have pain in your shoulder strap area.  You have heavy vaginal bleeding with or without blood clots. Document Released: 08/23/2000 Document Revised: 06/16/2013 Document Reviewed: 03/25/2013 [facility] Patient Information 2014 [place], [place].

## 2013-11-05 NOTE — OR Nursing (Signed)
Added surgeon delay.

## 2013-11-05 NOTE — Transfer of Care (Signed)
Immediate Anesthesia Transfer of Care Note  Patient: Connie Miller  Procedure(s) Performed: Procedure(s) with comments: DILATATION AND CURETTAGE  (N/A) -  with Ultrasound guidance  Patient Location: PACU  Anesthesia Type:General  Level of Consciousness: sedated  Airway & Oxygen Therapy: Patient Spontanous Breathing and Patient connected to nasal cannula oxygen  Post-op Assessment: Report given to PACU RN and Post -op Vital signs reviewed and stable  Post vital signs: stable  Complications: No apparent anesthesia complications

## 2013-11-05 NOTE — H&P (Signed)
  Chief Complaint: 59 y.o.  who presents with postmenopausal bleeding and a possible endocervical polyp  Details of Present Illness: There is a remote h/o a benign endometrial polyp.  The patient is taking tamoxifen.  In 2001, there was 9 mm cervical polyp noted.  A D&C/hysteroscopy was performed.  The external cervical os was stenotic.  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  BP 114/69  Pulse 72  Temp(Src) 98.1 F (36.7 C) (Oral)  Resp 16  Ht 5\' 2"  (1.575 m)  Wt 51.256 kg (113 lb)  BMI 20.66 kg/m2  SpO2 100%  Past Medical History  Diagnosis Date  . Ectopic pregnancy   . Cervical polyp Feb. 2011    benign  . Migraine     1 every 6 weeks. food triggered/hormonal  . Fibroid uterus 2007  . 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 Breast cancer, stage 0    History   Social History  . Marital Status: Married    Spouse Name: N/A    Number of Children: N/A  . Years of Education: N/A   Occupational History  . Not on file.   Social History Main Topics  . Smoking status: Never Smoker   . Smokeless tobacco: Never Used  . Alcohol Use: No  . Drug Use: No  . Sexual Activity: Yes    Partners: Male    Birth Control/ Protection: None   Other Topics Concern  . Not on file   Social History Narrative  . No narrative on file   Family History  Problem Relation Age of Onset  . Heart disease Father   . Early death Cousin     Pertinent items are noted in HPI.  Pre-Op Diagnosis: 76811 ? Endocervical polyp- post menopausal bleeding in the setting of tamoxifen use ?Cervical stenosis/hematocolpos   Planned Procedure: Procedure(s): DILATATION AND CURETTAGE /HYSTEROSCOPY with ultrasound guidance  I have reviewed the patient's history and have completed the physical exam and EPIPHANY SELTZER is acceptable for surgery.  Agnes Lawrence, MD 11/05/2013 11:05 AM

## 2013-11-05 NOTE — Anesthesia Preprocedure Evaluation (Signed)
Anesthesia Evaluation  Patient identified by MRN, date of birth, ID band Patient awake    Reviewed: Allergy & Precautions, H&P , Patient's Chart, lab work & pertinent test results, reviewed documented beta blocker date and time   Airway Mallampati: II TM Distance: >3 FB Neck ROM: full    Dental no notable dental hx.    Pulmonary  breath sounds clear to auscultation  Pulmonary exam normal       Cardiovascular Exercise Tolerance: Good Rhythm:regular Rate:Normal     Neuro/Psych    GI/Hepatic   Endo/Other    Renal/GU      Musculoskeletal   Abdominal   Peds  Hematology   Anesthesia Other Findings   Reproductive/Obstetrics                           Anesthesia Physical Anesthesia Plan  ASA: II  Anesthesia Plan:    Post-op Pain Management:    Induction: Intravenous  Airway Management Planned: LMA  Additional Equipment:   Intra-op Plan:   Post-operative Plan:   Informed Consent: I have reviewed the patients History and Physical, chart, labs and discussed the procedure including the risks, benefits and alternatives for the proposed anesthesia with the patient or authorized representative who has indicated his/her understanding and acceptance.   Dental Advisory Given and Dental advisory given  Plan Discussed with: CRNA and Surgeon  Anesthesia Plan Comments: (Discussed GA with LMA, possible sore throat, potential need to switch to ETT, N/V, pulmonary aspiration. Questions answered. )        Anesthesia Quick Evaluation

## 2013-11-08 ENCOUNTER — Encounter (HOSPITAL_COMMUNITY): Payer: Self-pay | Admitting: Obstetrics & Gynecology

## 2013-11-17 ENCOUNTER — Ambulatory Visit (INDEPENDENT_AMBULATORY_CARE_PROVIDER_SITE_OTHER): Payer: PRIVATE HEALTH INSURANCE | Admitting: Obstetrics & Gynecology

## 2013-11-17 ENCOUNTER — Encounter: Payer: Self-pay | Admitting: Obstetrics & Gynecology

## 2013-11-17 VITALS — BP 101/67 | HR 65 | Temp 98.1°F | Wt 109.0 lb

## 2013-11-17 DIAGNOSIS — Z09 Encounter for follow-up examination after completed treatment for conditions other than malignant neoplasm: Secondary | ICD-10-CM

## 2013-11-17 DIAGNOSIS — N858 Other specified noninflammatory disorders of uterus: Secondary | ICD-10-CM | POA: Insufficient documentation

## 2013-11-17 DIAGNOSIS — N898 Other specified noninflammatory disorders of vagina: Secondary | ICD-10-CM

## 2013-11-17 DIAGNOSIS — N882 Stricture and stenosis of cervix uteri: Secondary | ICD-10-CM

## 2013-11-17 DIAGNOSIS — N859 Noninflammatory disorder of uterus, unspecified: Secondary | ICD-10-CM

## 2013-11-17 NOTE — Patient Instructions (Signed)
Hysterectomy Information  A hysterectomy is a surgery in which your uterus is removed. This surgery may be done to treat various medical problems. After the surgery, you will no longer have menstrual periods. The surgery will also make you unable to become pregnant (sterile). The fallopian tubes and ovaries can be removed (bilateral salpingo-oophorectomy) during this surgery as well.  REASONS FOR A HYSTERECTOMY  Persistent, abnormal bleeding.  Lasting (chronic) pelvic pain or infection.  The lining of the uterus (endometrium) starts growing outside the uterus (endometriosis).  The endometrium starts growing in the muscle of the uterus (adenomyosis).  The uterus falls down into the vagina (pelvic organ prolapse).  Noncancerous growths in the uterus (uterine fibroids) that cause symptoms.  Precancerous cells.  Cervical cancer or uterine cancer. TYPES OF HYSTERECTOMIES  Supracervical hysterectomy In this type, the top part of the uterus is removed, but not the cervix.  Total hysterectomy The uterus and cervix are removed.  Radical hysterectomy The uterus, the cervix, and the fibrous tissue that holds the uterus in place in the pelvis (parametrium) are removed. WAYS A HYSTERECTOMY CAN BE PERFORMED  Abdominal hysterectomy A large surgical cut (incision) is made in the abdomen. The uterus is removed through this incision.  Vaginal hysterectomy An incision is made in the vagina. The uterus is removed through this incision. There are no abdominal incisions.  Conventional laparoscopic hysterectomy Three or four small incisions are made in the abdomen. A thin, lighted tube with a camera (laparoscope) is inserted into one of the incisions. Other tools are put through the other incisions. The uterus is cut into small pieces. The small pieces are removed through the incisions, or they are removed through the vagina.  Laparoscopically assisted vaginal hysterectomy (LAVH) Three or four small  incisions are made in the abdomen. Part of the surgery is performed laparoscopically and part vaginally. The uterus is removed through the vagina.  Robot-assisted laparoscopic hysterectomy A laparoscope and other tools are inserted into 3 or 4 small incisions in the abdomen. A computer-controlled device is used to give the surgeon a 3D image and to help control the surgical instruments. This allows for more precise movements of surgical instruments. The uterus is cut into small pieces and removed through the incisions or removed through the vagina. RISKS AND COMPLICATIONS  Possible complications associated with this procedure include:  Bleeding and risk of blood transfusion. Tell your health care provider if you do not want to receive any blood products.  Blood clots in the legs or lung.  Infection.  Injury to surrounding organs.  Problems or side effects related to anesthesia.  Conversion to an abdominal hysterectomy from one of the other techniques. WHAT TO EXPECT AFTER A HYSTERECTOMY  You will be given pain medicine.  You will need to have someone with you for the first 3 5 days after you go home.  You will need to follow up with your surgeon in 2 4 weeks after surgery to evaluate your progress.  You may have early menopause symptoms such as hot flashes, night sweats, and insomnia.  If you had a hysterectomy for a problem that was not cancer or not a condition that could lead to cancer, then you no longer need Pap tests. However, even if you no longer need a Pap test, a regular exam is a good idea to make sure no other problems are starting. Document Released: 02/19/2001 Document Revised: 06/16/2013 Document Reviewed: 05/03/2013 [facility] Patient Information 2014 [place].

## 2013-11-17 NOTE — Progress Notes (Signed)
Subjective:     Connie Miller is a 58 y.o. female here for a routine exam.  Current complaints: post- op follow up. Pt in office today to discuss further options.  Personal health questionnaire reviewed: yes.   Gynecologic History No LMP recorded. Patient is postmenopausal. Contraception: post menopausal status  Obstetric History OB History  Gravida Para Term Preterm AB SAB TAB Ectopic Multiple Living  3 0 0 0 3 2 0 1 0 0     # Outcome Date GA Lbr Len/2nd Weight Sex Delivery Anes PTL Lv  3 ECT 1999          2 SAB 1998          1 SAB 1997               The following portions of the patient's history were reviewed and updated as appropriate: allergies, current medications, past family history, past medical history, past social history, past surgical history and problem list.  Review of Systems Pertinent items are noted in HPI.    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  She elects to proceed w/hysterectomy.  Return for a preop visit.  Education materials provided.

## 2013-11-18 ENCOUNTER — Telehealth: Payer: Self-pay | Admitting: Oncology

## 2013-11-18 ENCOUNTER — Other Ambulatory Visit (HOSPITAL_BASED_OUTPATIENT_CLINIC_OR_DEPARTMENT_OTHER): Payer: PRIVATE HEALTH INSURANCE

## 2013-11-18 ENCOUNTER — Encounter: Payer: Self-pay | Admitting: Oncology

## 2013-11-18 ENCOUNTER — Ambulatory Visit (HOSPITAL_BASED_OUTPATIENT_CLINIC_OR_DEPARTMENT_OTHER): Payer: PRIVATE HEALTH INSURANCE | Admitting: Oncology

## 2013-11-18 VITALS — BP 120/74 | HR 71 | Temp 98.8°F | Resp 18 | Ht 62.0 in | Wt 110.9 lb

## 2013-11-18 DIAGNOSIS — D0590 Unspecified type of carcinoma in situ of unspecified breast: Secondary | ICD-10-CM

## 2013-11-18 DIAGNOSIS — Z170 Estrogen receptor positive status [ER+]: Secondary | ICD-10-CM

## 2013-11-18 DIAGNOSIS — D0591 Unspecified type of carcinoma in situ of right breast: Secondary | ICD-10-CM

## 2013-11-18 DIAGNOSIS — N898 Other specified noninflammatory disorders of vagina: Secondary | ICD-10-CM

## 2013-11-18 LAB — CBC WITH DIFFERENTIAL/PLATELET
BASO%: 1.1 % (ref 0.0–2.0)
Basophils Absolute: 0.1 10*3/uL (ref 0.0–0.1)
EOS%: 0.5 % (ref 0.0–7.0)
Eosinophils Absolute: 0 10*3/uL (ref 0.0–0.5)
HCT: 41.3 % (ref 34.8–46.6)
HGB: 13.6 g/dL (ref 11.6–15.9)
LYMPH%: 29.2 % (ref 14.0–49.7)
MCH: 30.9 pg (ref 25.1–34.0)
MCHC: 32.9 g/dL (ref 31.5–36.0)
MCV: 93.9 fL (ref 79.5–101.0)
MONO#: 0.4 10*3/uL (ref 0.1–0.9)
MONO%: 6.6 % (ref 0.0–14.0)
NEUT#: 3.3 10*3/uL (ref 1.5–6.5)
NEUT%: 62.6 % (ref 38.4–76.8)
Platelets: 219 10*3/uL (ref 145–400)
RBC: 4.4 10*6/uL (ref 3.70–5.45)
RDW: 13.1 % (ref 11.2–14.5)
WBC: 5.3 10*3/uL (ref 3.9–10.3)
lymph#: 1.6 10*3/uL (ref 0.9–3.3)

## 2013-11-18 LAB — COMPREHENSIVE METABOLIC PANEL (CC13)
ALT: 20 U/L (ref 0–55)
AST: 16 U/L (ref 5–34)
Albumin: 3.8 g/dL (ref 3.5–5.0)
Alkaline Phosphatase: 40 U/L (ref 40–150)
Anion Gap: 11 mEq/L (ref 3–11)
BUN: 15 mg/dL (ref 7.0–26.0)
CO2: 26 mEq/L (ref 22–29)
Calcium: 9.3 mg/dL (ref 8.4–10.4)
Chloride: 108 mEq/L (ref 98–109)
Creatinine: 0.7 mg/dL (ref 0.6–1.1)
Glucose: 102 mg/dl (ref 70–140)
Potassium: 3.8 mEq/L (ref 3.5–5.1)
Sodium: 145 mEq/L (ref 136–145)
Total Bilirubin: 0.36 mg/dL (ref 0.20–1.20)
Total Protein: 6.4 g/dL (ref 6.4–8.3)

## 2013-11-18 NOTE — Progress Notes (Signed)
OFFICE PROGRESS NOTE  CC Donnajean Lopes, MD [address] [place] [address] Dr. Rolm Bookbinder  DIAGNOSIS: 58 year old female who underwent a right breast lumpectomy and sentinel node biopsy for stage 0 right breast cancer   PRIOR THERAPY:  #1in March 2012 patient was found to have a mammographic abnormality in the right breast. 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 MRI did not show any other changes. The tumor was ER positive PR positive.  #2 she went on to have a right breast wire guided lumpectomy and right axilla sentinel lymph node biopsy 12/03/2010. The final pathology revealed no evidence of residual DCIS. There was noted to be atypical lobular hyperplasia. All margins were negative. One sentinel node was negative for disease. Tumor was ER +99% PR +100%.  #3 patient then went on to complete radiation therapy and was begun on tamoxifen 20 mg by Dr. Eston Esters as a chemopreventive. This was started in June 2012   CURRENT THERAPY: tamoxifen 20 mg daily.  INTERVAL HISTORY: Connie Miller 58 y.o. female returns for follow up visit. Overall patient is doing well. She remains on tamoxifen. Her primary complaint is periodic vaginal bleeding. Recently she was seen by her gynecologist and she was found to have vaginal spotting/bleeding. It is unclear whether this is due to the tamoxifen or some other etiology. Patient is contemplating having a hysterectomy performed. She and I had an extensive discussion regarding pros and cons of this today. I do think it seems reasonable for her to proceed the hysterectomy if it is being recommended by her gynecologist. This certainly with ease patient's mind regarding development of any gynecologic pathology. She is on tamoxifen and she would be at risk for uterine cancer. Today she denies any headaches double vision blurring of vision fevers chills night sweats. No shortness of breath chest  pains palpitations. No abdominal pain no diarrhea or constipation. She has no easy bruising or bleeding. She has no myalgias and arthralgias. No peripheral paresthesias or gait disturbances. Remainder of the 10 point review of systems is negative.   MEDICAL HISTORY: Past Medical History  Diagnosis Date  . Ectopic pregnancy   . Cervical polyp Feb. 2011    benign  . Migraine     1 every 6 weeks. food triggered/hormonal  . Fibroid uterus 2007  . 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 Breast cancer, stage 0     ALLERGIES:  has No Known Allergies.  MEDICATIONS:  Current Outpatient Prescriptions  Medication Sig Dispense Refill  . aspirin 81 MG tablet Take 81 mg by mouth at bedtime.       . Multiple Vitamins-Minerals (CENTRUM SILVER PO) Take 1 tablet by mouth daily.        . pravastatin (PRAVACHOL) 20 MG tablet Take 20 mg by mouth daily.      . SUMAtriptan (IMITREX) 100 MG tablet Take 100 mg by mouth as needed for migraine.       . tamoxifen (NOLVADEX) 20 MG tablet Take 1 tablet (20 mg total) by mouth daily.  90 tablet  12   No current facility-administered medications for this visit.    SURGICAL HISTORY:  Past Surgical History  Procedure Laterality Date  . Ectopic pregnancy surgery  August 1997  . Cervical polypectomy  feb. 2011  . Skin cancer excision  1996    removal of small spot on eyelid, noninvasive (basal cell) skin cancer  .  Breast lumpectomy      right breast lumpectomy, snbx  . Hysteroscopy w/d&c N/A 11/05/2013    Procedure: DILATATION AND CURETTAGE ;  Surgeon: [name], MD;  Location: [facility];  Service: Gynecology;  Laterality: N/A;   with Ultrasound guidance    REVIEW OF SYSTEMS:  A comprehensive review of systems was negative.   HEALTH MAINTENANCE:  PHYSICAL EXAMINATION: Blood pressure 120/74, pulse 71, temperature 98.8 F (37.1 C), temperature source Oral, resp. rate 18, height 5\' 2"  (1.575 m), weight 110 lb 14.4 oz (50.304 kg). Body mass index is  20.28 kg/(m^2). ECOG PERFORMANCE STATUS: 0 - Asymptomatic   General appearance: alert, cooperative and appears stated age Resp: clear to auscultation bilaterally and normal percussion bilaterally Back: symmetric, no curvature. ROM normal. No CVA tenderness. Cardio: regular rate and rhythm GI: soft, non-tender; bowel sounds normal; no masses,  no organomegaly Extremities: extremities normal, atraumatic, no cyanosis or edema Neurologic: Grossly normal   LABORATORY DATA: Lab Results  Component Value Date   WBC 5.3 11/18/2013   HGB 13.6 11/18/2013   HCT 41.3 11/18/2013   MCV 93.9 11/18/2013   PLT 219 11/18/2013      Chemistry      Component Value Date/Time   NA 145 11/18/2013 0930   NA 141 11/10/2012 1318   K 3.8 11/18/2013 0930   K 3.9 11/10/2012 1318   CL 104 11/10/2012 1318   CO2 26 11/18/2013 0930   CO2 29 11/10/2012 1318   BUN 15.0 11/18/2013 0930   BUN 11 11/10/2012 1318   CREATININE 0.7 11/18/2013 0930   CREATININE 0.72 11/10/2012 1318      Component Value Date/Time   CALCIUM 9.3 11/18/2013 0930   CALCIUM 9.6 11/10/2012 1318   ALKPHOS 40 11/18/2013 0930   ALKPHOS 41 11/10/2012 1318   AST 16 11/18/2013 0930   AST 19 11/10/2012 1318   ALT 20 11/18/2013 0930   ALT 20 11/10/2012 1318   BILITOT 0.36 11/18/2013 0930   BILITOT 0.3 11/10/2012 1318       RADIOGRAPHIC STUDIES:  No results found.  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 Patient is status post lumpectomy with sentinel lymph node biopsy March 2012. Thereafter she underwent radiation therapy by Dr. Weston Settle her. She was then placed on tamoxifen 20 mg daily. She's tolerating it well. She has no evidence of recurrent disease.  #2 vaginal bleeding: Unclear etiology. Patient is working with her gynecologist regarding the best course of action. She is thinking of having a hysterectomy performed I do  think that is reasonable given the patient's situation of having to continue tamoxifen.  #3 patient will continue tamoxifen 20 mg on a daily basis.  #4 she will be seen back in 6 months for followup with blood work.   All questions were answered. The patient knows to call the clinic with any problems, questions or concerns. We can certainly see the patient much sooner if necessary.  I spent 20 minutes counseling the patient face to face. The total time spent in the appointment was 25 minutes.    Marcy Panning, MD Medical/Oncology [facility] [phone] (beeper) [phone] (Office)  11/18/2013, 10:58 AM

## 2013-11-18 NOTE — Telephone Encounter (Signed)
, °

## 2013-11-25 ENCOUNTER — Other Ambulatory Visit: Payer: Self-pay | Admitting: Oncology

## 2013-12-01 ENCOUNTER — Ambulatory Visit: Payer: PRIVATE HEALTH INSURANCE | Admitting: Obstetrics & Gynecology

## 2013-12-01 ENCOUNTER — Encounter: Payer: Self-pay | Admitting: Obstetrics & Gynecology

## 2013-12-02 ENCOUNTER — Ambulatory Visit (INDEPENDENT_AMBULATORY_CARE_PROVIDER_SITE_OTHER): Payer: PRIVATE HEALTH INSURANCE | Admitting: Obstetrics & Gynecology

## 2013-12-02 VITALS — BP 110/74 | HR 73 | Wt 112.0 lb

## 2013-12-02 DIAGNOSIS — Z01818 Encounter for other preprocedural examination: Secondary | ICD-10-CM

## 2013-12-02 NOTE — Progress Notes (Signed)
Subjective:     Connie Miller is a 58 y.o. female here for a pre op exam.  Current complaints:pt is in office today to discuss hysterectomy.  Pt had D&C in February and is doing well now.  Personal health questionnaire reviewed: yes.   Gynecologic History No LMP recorded. Patient is postmenopausal.   Obstetric History OB History  Gravida Para Term Preterm AB SAB TAB Ectopic Multiple Living  3 0 0 0 3 2 0 1 0 0     # Outcome Date GA Lbr Len/2nd Weight Sex Delivery Anes PTL Lv  3 ECT 1999          2 SAB 1998          1 SAB 1997               The following portions of the patient's history were reviewed and updated as appropriate: allergies, current medications, past family history, past medical history, past social history, past surgical history and problem list.  Review of Systems Pertinent items are noted in HPI.    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

## 2013-12-05 ENCOUNTER — Encounter: Payer: Self-pay | Admitting: Obstetrics & Gynecology

## 2013-12-05 NOTE — Patient Instructions (Signed)
Hysterectomy Information  A hysterectomy is a surgery in which your uterus is removed. This surgery may be done to treat various medical problems. After the surgery, you will no longer have menstrual periods. The surgery will also make you unable to become pregnant (sterile). The fallopian tubes and ovaries can be removed (bilateral salpingo-oophorectomy) during this surgery as well.  REASONS FOR A HYSTERECTOMY  Persistent, abnormal bleeding.  Lasting (chronic) pelvic pain or infection.  The lining of the uterus (endometrium) starts growing outside the uterus (endometriosis).  The endometrium starts growing in the muscle of the uterus (adenomyosis).  The uterus falls down into the vagina (pelvic organ prolapse).  Noncancerous growths in the uterus (uterine fibroids) that cause symptoms.  Precancerous cells.  Cervical cancer or uterine cancer. TYPES OF HYSTERECTOMIES  Supracervical hysterectomy In this type, the top part of the uterus is removed, but not the cervix.  Total hysterectomy The uterus and cervix are removed.  Radical hysterectomy The uterus, the cervix, and the fibrous tissue that holds the uterus in place in the pelvis (parametrium) are removed. WAYS A HYSTERECTOMY CAN BE PERFORMED  Abdominal hysterectomy A large surgical cut (incision) is made in the abdomen. The uterus is removed through this incision.  Vaginal hysterectomy An incision is made in the vagina. The uterus is removed through this incision. There are no abdominal incisions.  Conventional laparoscopic hysterectomy Three or four small incisions are made in the abdomen. A thin, lighted tube with a camera (laparoscope) is inserted into one of the incisions. Other tools are put through the other incisions. The uterus is cut into small pieces. The small pieces are removed through the incisions, or they are removed through the vagina.  Laparoscopically assisted vaginal hysterectomy (LAVH) Three or four small  incisions are made in the abdomen. Part of the surgery is performed laparoscopically and part vaginally. The uterus is removed through the vagina.  Robot-assisted laparoscopic hysterectomy A laparoscope and other tools are inserted into 3 or 4 small incisions in the abdomen. A computer-controlled device is used to give the surgeon a 3D image and to help control the surgical instruments. This allows for more precise movements of surgical instruments. The uterus is cut into small pieces and removed through the incisions or removed through the vagina. RISKS AND COMPLICATIONS  Possible complications associated with this procedure include:  Bleeding and risk of blood transfusion. Tell your health care provider if you do not want to receive any blood products.  Blood clots in the legs or lung.  Infection.  Injury to surrounding organs.  Problems or side effects related to anesthesia.  Conversion to an abdominal hysterectomy from one of the other techniques. WHAT TO EXPECT AFTER A HYSTERECTOMY  You will be given pain medicine.  You will need to have someone with you for the first 3 5 days after you go home.  You will need to follow up with your surgeon in 2 4 weeks after surgery to evaluate your progress.  You may have early menopause symptoms such as hot flashes, night sweats, and insomnia.  If you had a hysterectomy for a problem that was not cancer or not a condition that could lead to cancer, then you no longer need Pap tests. However, even if you no longer need a Pap test, a regular exam is a good idea to make sure no other problems are starting. Document Released: 02/19/2001 Document Revised: 06/16/2013 Document Reviewed: 05/03/2013 [facility] Patient Information 2014 [place].

## 2013-12-13 ENCOUNTER — Other Ambulatory Visit: Payer: Self-pay | Admitting: *Deleted

## 2013-12-15 ENCOUNTER — Encounter: Payer: Self-pay | Admitting: Obstetrics & Gynecology

## 2013-12-21 ENCOUNTER — Encounter: Payer: Self-pay | Admitting: Obstetrics & Gynecology

## 2014-01-24 ENCOUNTER — Encounter (HOSPITAL_COMMUNITY): Payer: Self-pay | Admitting: Pharmacist

## 2014-02-01 ENCOUNTER — Encounter (HOSPITAL_COMMUNITY)
Admission: RE | Admit: 2014-02-01 | Discharge: 2014-02-01 | Disposition: A | Discharge status: Home / Self Care | Payer: PRIVATE HEALTH INSURANCE | Source: Ambulatory Visit | Attending: Obstetrics & Gynecology | Admitting: Obstetrics & Gynecology

## 2014-02-01 ENCOUNTER — Encounter (HOSPITAL_COMMUNITY): Payer: Self-pay

## 2014-02-01 HISTORY — DX: Hyperlipidemia, unspecified: E78.5

## 2014-02-01 LAB — CBC
HCT: 39.5 % (ref 36.0–46.0)
Hemoglobin: 12.9 g/dL (ref 12.0–15.0)
MCH: 30.8 pg (ref 26.0–34.0)
MCHC: 32.7 g/dL (ref 30.0–36.0)
MCV: 94.3 fL (ref 78.0–100.0)
Platelets: 224 10*3/uL (ref 150–400)
RBC: 4.19 MIL/uL (ref 3.87–5.11)
RDW: 13.5 % (ref 11.5–15.5)
WBC: 5.7 10*3/uL (ref 4.0–10.5)

## 2014-02-01 NOTE — Patient Instructions (Addendum)
   Your procedure is scheduled on:  Friday, May 29  Enter through the Main Entrance of [facility] at: Canon up the phone at the desk and dial [phone] and inform us of your arrival.  Please call this number if you have any problems the morning of surgery: [phone]  Remember: Do not eat or drink after midnight: Thursday Take these medicines the morning of surgery with a SIP OF WATER: tamoxifen  Do not wear jewelry, make-up, or FINGER nail polish No metal in your hair or on your body. Do not wear lotions, powders, perfumes.  You may wear deodorant.  Do not bring valuables to the hospital. Contacts, dentures or bridgework may not be worn into surgery.  Leave suitcase in the car. After Surgery it may be brought to your room. For patients being admitted to the hospital, checkout time is 11:00am the day of discharge.  Home with husband Coralyn Mark cell [phone] or mother Adelle cell [phone].

## 2014-02-02 NOTE — H&P (Signed)
Subjective:  Connie Miller is a 58 y.o. female.  She has had several episodes of postmenopausal bleeding in the setting of tamoxifen treatment.  She has undergone several D&C procedures.  She has been diagnosed with a benign endometrial polyp and a small myoma.  There is significant agglutination of the upper vagina and cervical stenosis.  She has also had the hematometra drained during two of the above noted procedures. An U/S in 2/15 was suggestive of an endocervical polyp that was not appreciated on a subsequent D&C.  The endometrial lining was thickened measuring 9 mm.   There are no associated symptoms with the bleeding.   Pertinent Gyn History:  Blood transfusions: none STDs: no past history Preventive screening:   Last pap: normal Date: 6/14  Patient Active Problem List   Diagnosis Date Noted  . Cervical stenosis (uterine cervix) 11/17/2013  . 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 Routine gynecological examination 03/04/2013  . Breast cancer, stage 0 03/18/2011   Past Medical History  Diagnosis Date  . Ectopic pregnancy   . Cervical polyp Feb. 2011    benign  . Migraine     1 every 6 weeks. food triggered/hormonal  . Fibroid uterus 2007  . 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 Breast cancer, stage 0   . Hyperlipidemia     Past Surgical History  Procedure Laterality Date  . Ectopic pregnancy surgery  August 1997    Laparoscopy for ectopic  . Cervical polypectomy  feb. 2011  . Skin cancer excision  1996    removal of small spot on eyelid, noninvasive (basal cell) skin cancer  . Breast lumpectomy      right breast lumpectomy, snbx  . Hysteroscopy w/d&c N/A 11/05/2013    Procedure: DILATATION AND CURETTAGE ;  Surgeon: [name], MD;  Location: [facility];  Service: Gynecology;  Laterality: N/A;   with Ultrasound guidance  . Dilation and curettage of uterus      several d &c - polyps and bleeding  .  Tonsillectomy    . Wisdom tooth extraction      No prescriptions prior to admission   No Known Allergies  History  Substance Use Topics  . Smoking status: Never Smoker   . Smokeless tobacco: Never Used  . Alcohol Use: No    Family History  Problem Relation Age of Onset  . Heart disease Father   . Early death Cousin      Review of Systems Pertinent items are noted in HPI.    Objective:      General:   alert  Skin:   no rash or abnormalities  Lungs:   clear to auscultation bilaterally  Heart:   regular rate and rhythm, S1, S2 normal, no murmur, click, rub or gallop     Abdomen:  normal findings: no organomegaly, soft, non-tender and no hernia  Pelvis:  External genitalia: normal general appearance Urinary system: urethral meatus normal and bladder without fullness, nontender Vaginal: agglutination of the upper vagina Cervix: normal to palpation Adnexa: normal bimanual exam Uterus: anteverted and non-tender, normal size      Assessment/Plan:  Postmenopausal, cervical stenosis--recurrent hematometra in the setting of tamoxifen  Doubt a neoplastic process at present   I had a lengthy discussion with the patient regarding her bleeding and consideration for the use of cervical dilators versus hysterectomy.  Procedure, risks, reasons, benefits and complications (including injury to  bowel, bladder, major blood vessel, ureter, bleeding, possibility of transfusion, infection, thromboembolism or fistula formation) were reviewed in detail. Consent was signed and preop testing was ordered.  Instructions were reviewed, including NPO after midnight.

## 2014-02-03 MED ORDER — METRONIDAZOLE IN NACL 5-0.79 MG/ML-% IV SOLN
500.0000 mg | Freq: Once | INTRAVENOUS | Status: AC
  Administered 2014-02-04: 500 mg via INTRAVENOUS
  Filled 2014-02-03: qty 100

## 2014-02-04 ENCOUNTER — Encounter (HOSPITAL_COMMUNITY): Payer: PRIVATE HEALTH INSURANCE | Admitting: Anesthesiology

## 2014-02-04 ENCOUNTER — Inpatient Hospital Stay (HOSPITAL_COMMUNITY)
Admission: RE | Admit: 2014-02-04 | Discharge: 2014-02-06 | DRG: 743 | Disposition: A | Discharge status: Home / Self Care | Payer: PRIVATE HEALTH INSURANCE | Source: Ambulatory Visit | Attending: Obstetrics & Gynecology | Admitting: Obstetrics & Gynecology

## 2014-02-04 ENCOUNTER — Inpatient Hospital Stay (HOSPITAL_COMMUNITY): Payer: PRIVATE HEALTH INSURANCE | Admitting: Anesthesiology

## 2014-02-04 ENCOUNTER — Encounter (HOSPITAL_COMMUNITY): Payer: Self-pay

## 2014-02-04 ENCOUNTER — Encounter (HOSPITAL_COMMUNITY)
Admission: RE | Disposition: A | Discharge status: Home / Self Care | Payer: Self-pay | Source: Ambulatory Visit | Attending: Obstetrics & Gynecology

## 2014-02-04 DIAGNOSIS — N840 Polyp of corpus uteri: Secondary | ICD-10-CM | POA: Diagnosis present

## 2014-02-04 DIAGNOSIS — N857 Hematometra: Secondary | ICD-10-CM

## 2014-02-04 DIAGNOSIS — N72 Inflammatory disease of cervix uteri: Secondary | ICD-10-CM | POA: Diagnosis present

## 2014-02-04 DIAGNOSIS — Z853 Personal history of malignant neoplasm of breast: Secondary | ICD-10-CM

## 2014-02-04 DIAGNOSIS — N882 Stricture and stenosis of cervix uteri: Secondary | ICD-10-CM

## 2014-02-04 DIAGNOSIS — Z8249 Family history of ischemic heart disease and other diseases of the circulatory system: Secondary | ICD-10-CM

## 2014-02-04 DIAGNOSIS — M4802 Spinal stenosis, cervical region: Secondary | ICD-10-CM | POA: Diagnosis present

## 2014-02-04 DIAGNOSIS — N83209 Unspecified ovarian cyst, unspecified side: Secondary | ICD-10-CM | POA: Diagnosis present

## 2014-02-04 DIAGNOSIS — Z85828 Personal history of other malignant neoplasm of skin: Secondary | ICD-10-CM

## 2014-02-04 DIAGNOSIS — N950 Postmenopausal bleeding: Principal | ICD-10-CM | POA: Diagnosis present

## 2014-02-04 DIAGNOSIS — N838 Other noninflammatory disorders of ovary, fallopian tube and broad ligament: Secondary | ICD-10-CM | POA: Diagnosis present

## 2014-02-04 HISTORY — PX: ABDOMINAL HYSTERECTOMY: SHX81

## 2014-02-04 LAB — TYPE AND SCREEN
ABO/RH(D): A POS
Antibody Screen: NEGATIVE

## 2014-02-04 LAB — ABO/RH: ABO/RH(D): A POS

## 2014-02-04 SURGERY — HYSTERECTOMY, ABDOMINAL
Anesthesia: General | Laterality: Bilateral

## 2014-02-04 MED ORDER — GLYCOPYRROLATE 0.2 MG/ML IJ SOLN
INTRAMUSCULAR | Status: DC | PRN
  Administered 2014-02-04: 0.6 mg via INTRAVENOUS

## 2014-02-04 MED ORDER — MIDAZOLAM HCL 2 MG/2ML IJ SOLN
INTRAMUSCULAR | Status: DC | PRN
  Administered 2014-02-04: 2 mg via INTRAVENOUS

## 2014-02-04 MED ORDER — PHENYLEPHRINE 40 MCG/ML (10ML) SYRINGE FOR IV PUSH (FOR BLOOD PRESSURE SUPPORT)
PREFILLED_SYRINGE | INTRAVENOUS | Status: AC
  Filled 2014-02-04: qty 5

## 2014-02-04 MED ORDER — SIMETHICONE 80 MG PO CHEW: 80.0000 mg | CHEWABLE_TABLET | Freq: Four times a day (QID) | ORAL | Status: DC | PRN

## 2014-02-04 MED ORDER — ACETAMINOPHEN 500 MG PO TABS
1000.0000 mg | ORAL_TABLET | Freq: Four times a day (QID) | ORAL | Status: DC
  Administered 2014-02-04 – 2014-02-06 (×6): 1000 mg via ORAL
  Filled 2014-02-04 (×7): qty 2

## 2014-02-04 MED ORDER — MAGNESIUM HYDROXIDE 400 MG/5ML PO SUSP
30.0000 mL | Freq: Two times a day (BID) | ORAL | Status: AC
  Administered 2014-02-04: 30 mL via ORAL
  Filled 2014-02-04 (×2): qty 30

## 2014-02-04 MED ORDER — ROCURONIUM BROMIDE 100 MG/10ML IV SOLN
INTRAVENOUS | Status: AC
  Filled 2014-02-04: qty 1

## 2014-02-04 MED ORDER — PANTOPRAZOLE SODIUM 20 MG PO TBEC
20.0000 mg | DELAYED_RELEASE_TABLET | Freq: Two times a day (BID) | ORAL | Status: DC
  Administered 2014-02-04 – 2014-02-05 (×2): 20 mg via ORAL
  Filled 2014-02-04 (×5): qty 1

## 2014-02-04 MED ORDER — LACTATED RINGERS IV SOLN
INTRAVENOUS | Status: DC
  Administered 2014-02-04: 75 mL/h via INTRAVENOUS

## 2014-02-04 MED ORDER — LACTATED RINGERS IV SOLN
INTRAVENOUS | Status: DC
  Administered 2014-02-04 (×2): via INTRAVENOUS

## 2014-02-04 MED ORDER — DEXTROSE-NACL 5-0.45 % IV SOLN
INTRAVENOUS | Status: DC
  Administered 2014-02-04 (×2): via INTRAVENOUS

## 2014-02-04 MED ORDER — MENTHOL 3 MG MT LOZG: 1.0000 | LOZENGE | OROMUCOSAL | Status: DC | PRN

## 2014-02-04 MED ORDER — ONDANSETRON HCL 4 MG PO TABS: 4.0000 mg | ORAL_TABLET | Freq: Four times a day (QID) | ORAL | Status: DC | PRN

## 2014-02-04 MED ORDER — HYDROMORPHONE HCL PF 1 MG/ML IJ SOLN
INTRAMUSCULAR | Status: AC
  Administered 2014-02-04: 0.5 mg via INTRAVENOUS
  Filled 2014-02-04: qty 1

## 2014-02-04 MED ORDER — CELECOXIB 200 MG PO CAPS
ORAL_CAPSULE | ORAL | Status: AC
  Filled 2014-02-04: qty 2

## 2014-02-04 MED ORDER — LIDOCAINE HCL (CARDIAC) 20 MG/ML IV SOLN
INTRAVENOUS | Status: DC | PRN
  Administered 2014-02-04: 20 mg via INTRAVENOUS
  Administered 2014-02-04: 30 mg via INTRAVENOUS

## 2014-02-04 MED ORDER — KETOROLAC TROMETHAMINE 30 MG/ML IJ SOLN: 30.0000 mg | Freq: Once | INTRAMUSCULAR | Status: DC

## 2014-02-04 MED ORDER — FENTANYL CITRATE 0.05 MG/ML IJ SOLN
INTRAMUSCULAR | Status: AC
  Filled 2014-02-04: qty 5

## 2014-02-04 MED ORDER — CEFAZOLIN SODIUM-DEXTROSE 2-3 GM-% IV SOLR
INTRAVENOUS | Status: AC
  Filled 2014-02-04: qty 50

## 2014-02-04 MED ORDER — HYDROMORPHONE HCL PF 1 MG/ML IJ SOLN
0.2000 mg | INTRAMUSCULAR | Status: DC | PRN
  Administered 2014-02-04 (×2): 0.6 mg via INTRAVENOUS
  Filled 2014-02-04 (×2): qty 1

## 2014-02-04 MED ORDER — NEOSTIGMINE METHYLSULFATE 10 MG/10ML IV SOLN
INTRAVENOUS | Status: AC
  Filled 2014-02-04: qty 1

## 2014-02-04 MED ORDER — BUPIVACAINE LIPOSOME 1.3 % IJ SUSP
20.0000 mL | Freq: Once | INTRAMUSCULAR | Status: AC
Start: 2014-02-04 — End: 2014-02-04
  Administered 2014-02-04: 20 mL
  Filled 2014-02-04: qty 20

## 2014-02-04 MED ORDER — 0.9 % SODIUM CHLORIDE (POUR BTL) OPTIME
TOPICAL | Status: DC | PRN
  Administered 2014-02-04: 2000 mL

## 2014-02-04 MED ORDER — NEOSTIGMINE METHYLSULFATE 10 MG/10ML IV SOLN
INTRAVENOUS | Status: DC | PRN
  Administered 2014-02-04: 3 mg via INTRAVENOUS

## 2014-02-04 MED ORDER — GABAPENTIN 400 MG PO CAPS
ORAL_CAPSULE | ORAL | Status: AC
  Filled 2014-02-04: qty 1

## 2014-02-04 MED ORDER — PROMETHAZINE HCL 25 MG/ML IJ SOLN: 6.2500 mg | INTRAMUSCULAR | Status: DC | PRN

## 2014-02-04 MED ORDER — IBUPROFEN 600 MG PO TABS: 600.0000 mg | ORAL_TABLET | Freq: Four times a day (QID) | ORAL | Status: DC | PRN

## 2014-02-04 MED ORDER — CELECOXIB 200 MG PO CAPS
400.0000 mg | ORAL_CAPSULE | Freq: Once | ORAL | Status: AC
  Administered 2014-02-04: 400 mg via ORAL

## 2014-02-04 MED ORDER — PROPOFOL 10 MG/ML IV BOLUS
INTRAVENOUS | Status: DC | PRN
  Administered 2014-02-04: 160 mg via INTRAVENOUS

## 2014-02-04 MED ORDER — GLYCOPYRROLATE 0.2 MG/ML IJ SOLN
INTRAMUSCULAR | Status: AC
  Filled 2014-02-04: qty 3

## 2014-02-04 MED ORDER — HYDROMORPHONE HCL PF 1 MG/ML IJ SOLN
INTRAMUSCULAR | Status: AC
  Filled 2014-02-04: qty 1

## 2014-02-04 MED ORDER — OXYCODONE HCL 5 MG PO TABS: 10.0000 mg | ORAL_TABLET | ORAL | Status: DC | PRN

## 2014-02-04 MED ORDER — KETOROLAC TROMETHAMINE 30 MG/ML IJ SOLN
30.0000 mg | Freq: Four times a day (QID) | INTRAMUSCULAR | Status: DC
  Administered 2014-02-04 – 2014-02-05 (×3): 30 mg via INTRAVENOUS
  Filled 2014-02-04 (×3): qty 1

## 2014-02-04 MED ORDER — HYDROMORPHONE HCL PF 1 MG/ML IJ SOLN
INTRAMUSCULAR | Status: DC | PRN
  Administered 2014-02-04: 0.5 mg via INTRAVENOUS

## 2014-02-04 MED ORDER — KETOROLAC TROMETHAMINE 30 MG/ML IJ SOLN
INTRAMUSCULAR | Status: AC
  Filled 2014-02-04: qty 1

## 2014-02-04 MED ORDER — FENTANYL CITRATE 0.05 MG/ML IJ SOLN
INTRAMUSCULAR | Status: DC | PRN
  Administered 2014-02-04 (×4): 50 ug via INTRAVENOUS

## 2014-02-04 MED ORDER — CEFAZOLIN SODIUM-DEXTROSE 2-3 GM-% IV SOLR
2.0000 g | INTRAVENOUS | Status: AC
  Administered 2014-02-04: 2 g via INTRAVENOUS

## 2014-02-04 MED ORDER — ENSURE COMPLETE PO LIQD
237.0000 mL | Freq: Two times a day (BID) | ORAL | Status: DC
  Filled 2014-02-04 (×3): qty 237

## 2014-02-04 MED ORDER — ONDANSETRON HCL 4 MG/2ML IJ SOLN
INTRAMUSCULAR | Status: AC
  Filled 2014-02-04: qty 2

## 2014-02-04 MED ORDER — LIDOCAINE HCL (CARDIAC) 20 MG/ML IV SOLN
INTRAVENOUS | Status: AC
  Filled 2014-02-04: qty 5

## 2014-02-04 MED ORDER — SODIUM CHLORIDE 0.9 % IJ SOLN
INTRAMUSCULAR | Status: AC
  Filled 2014-02-04: qty 20

## 2014-02-04 MED ORDER — KETOROLAC TROMETHAMINE 30 MG/ML IJ SOLN: 15.0000 mg | Freq: Once | INTRAMUSCULAR | Status: DC | PRN

## 2014-02-04 MED ORDER — PHENYLEPHRINE HCL 10 MG/ML IJ SOLN
INTRAMUSCULAR | Status: DC | PRN
  Administered 2014-02-04: 40 ug via INTRAVENOUS
  Administered 2014-02-04 (×2): 80 ug via INTRAVENOUS

## 2014-02-04 MED ORDER — KETOROLAC TROMETHAMINE 30 MG/ML IJ SOLN
INTRAMUSCULAR | Status: DC | PRN
  Administered 2014-02-04: 30 mg via INTRAVENOUS

## 2014-02-04 MED ORDER — OXYCODONE HCL 5 MG PO TABS: 10.0000 mg | ORAL_TABLET | Freq: Four times a day (QID) | ORAL | Status: DC | PRN

## 2014-02-04 MED ORDER — STERILE WATER FOR IRRIGATION IR SOLN
Status: DC | PRN
  Administered 2014-02-04: 1000 mL

## 2014-02-04 MED ORDER — HYDROMORPHONE HCL PF 1 MG/ML IJ SOLN
0.2500 mg | INTRAMUSCULAR | Status: DC | PRN
  Administered 2014-02-04 (×2): 0.5 mg via INTRAVENOUS

## 2014-02-04 MED ORDER — FLUMAZENIL 0.5 MG/5ML IV SOLN
INTRAVENOUS | Status: DC | PRN
  Administered 2014-02-04: 0.2 mg via INTRAVENOUS

## 2014-02-04 MED ORDER — ONDANSETRON HCL 4 MG/2ML IJ SOLN
4.0000 mg | Freq: Four times a day (QID) | INTRAMUSCULAR | Status: DC | PRN
  Administered 2014-02-05: 4 mg via INTRAVENOUS
  Filled 2014-02-04: qty 2

## 2014-02-04 MED ORDER — MIDAZOLAM HCL 2 MG/2ML IJ SOLN
INTRAMUSCULAR | Status: AC
  Filled 2014-02-04: qty 2

## 2014-02-04 MED ORDER — DOCUSATE SODIUM 100 MG PO CAPS
100.0000 mg | ORAL_CAPSULE | Freq: Two times a day (BID) | ORAL | Status: DC
  Administered 2014-02-04 – 2014-02-06 (×4): 100 mg via ORAL
  Filled 2014-02-04 (×4): qty 1

## 2014-02-04 MED ORDER — MEPERIDINE HCL 25 MG/ML IJ SOLN: 6.2500 mg | INTRAMUSCULAR | Status: DC | PRN

## 2014-02-04 MED ORDER — ONDANSETRON HCL 4 MG/2ML IJ SOLN
INTRAMUSCULAR | Status: DC | PRN
  Administered 2014-02-04: 4 mg via INTRAVENOUS

## 2014-02-04 MED ORDER — PROPOFOL 10 MG/ML IV EMUL
INTRAVENOUS | Status: AC
  Filled 2014-02-04: qty 20

## 2014-02-04 MED ORDER — ROCURONIUM BROMIDE 100 MG/10ML IV SOLN
INTRAVENOUS | Status: DC | PRN
  Administered 2014-02-04: 10 mg via INTRAVENOUS
  Administered 2014-02-04: 40 mg via INTRAVENOUS

## 2014-02-04 MED ORDER — GABAPENTIN 400 MG PO CAPS
400.0000 mg | ORAL_CAPSULE | Freq: Once | ORAL | Status: AC
  Administered 2014-02-04: 400 mg via ORAL

## 2014-02-04 MED ORDER — DEXAMETHASONE SODIUM PHOSPHATE 10 MG/ML IJ SOLN
INTRAMUSCULAR | Status: DC | PRN
  Administered 2014-02-04: 10 mg via INTRAVENOUS

## 2014-02-04 MED ORDER — KETOROLAC TROMETHAMINE 30 MG/ML IJ SOLN: 30.0000 mg | Freq: Four times a day (QID) | INTRAMUSCULAR | Status: DC

## 2014-02-04 SURGICAL SUPPLY — 54 items
APL SKNCLS STERI-STRIP NONHPOA (GAUZE/BANDAGES/DRESSINGS) ×1
BENZOIN TINCTURE PRP APPL 2/3 (GAUZE/BANDAGES/DRESSINGS) ×1 IMPLANT
BRR ADH 6X5 SEPRAFILM 1 SHT (MISCELLANEOUS)
CANISTER SUCT 3000ML (MISCELLANEOUS) ×2 IMPLANT
CATH FOLEY 3WAY  5CC 16FR (CATHETERS)
CATH FOLEY 3WAY 5CC 16FR (CATHETERS) IMPLANT
CELLS DAT CNTRL 66122 CELL SVR (MISCELLANEOUS) ×1 IMPLANT
CLIP LIGATING EXTRA MED SLVR (CLIP) IMPLANT
CLOTH BEACON ORANGE TIMEOUT ST (SAFETY) ×2 IMPLANT
CONT PATH 16OZ SNAP LID 3702 (MISCELLANEOUS) ×2 IMPLANT
DECANTER SPIKE VIAL GLASS SM (MISCELLANEOUS) ×2 IMPLANT
DRAPE UNDERBUTTOCKS STRL (DRAPE) ×2 IMPLANT
DRAPE WARM FLUID 44X44 (DRAPE) ×2 IMPLANT
DRSG OPSITE POSTOP 4X10 (GAUZE/BANDAGES/DRESSINGS) ×2 IMPLANT
DURAPREP 26ML APPLICATOR (WOUND CARE) ×2 IMPLANT
ELECT BLADE 6 FLAT ULTRCLN (ELECTRODE) ×1 IMPLANT
GAUZE SPONGE 4X4 16PLY XRAY LF (GAUZE/BANDAGES/DRESSINGS) ×2 IMPLANT
GLOVE BIO SURGEON STRL SZ 6.5 (GLOVE) ×2 IMPLANT
GLOVE BIOGEL PI IND STRL 7.0 (GLOVE) ×2 IMPLANT
GLOVE BIOGEL PI INDICATOR 7.0 (GLOVE) ×2
GOWN STRL REUS W/TWL LRG LVL3 (GOWN DISPOSABLE) ×6 IMPLANT
NDL HYPO 25X1 1.5 SAFETY (NEEDLE) IMPLANT
NEEDLE HYPO 25X1 1.5 SAFETY (NEEDLE) ×4 IMPLANT
NS IRRIG 1000ML POUR BTL (IV SOLUTION) ×3 IMPLANT
PACK ABDOMINAL GYN (CUSTOM PROCEDURE TRAY) ×2 IMPLANT
PAD ABD 7.5X8 STRL (GAUZE/BANDAGES/DRESSINGS) ×1 IMPLANT
PAD MAGNETIC INST (MISCELLANEOUS) ×1 IMPLANT
PAD OB MATERNITY 4.3X12.25 (PERSONAL CARE ITEMS) ×2 IMPLANT
PLUG CATH AND CAP STER (CATHETERS) IMPLANT
PROTECTOR NERVE ULNAR (MISCELLANEOUS) ×2 IMPLANT
RETRACTOR WND ALEXIS 18 MED (MISCELLANEOUS) IMPLANT
RETRACTOR WND ALEXIS 25 LRG (MISCELLANEOUS) IMPLANT
RTRCTR WOUND ALEXIS 18CM MED (MISCELLANEOUS) ×2
RTRCTR WOUND ALEXIS 25CM LRG (MISCELLANEOUS)
SCRUB PCMX 4 OZ (MISCELLANEOUS) ×2 IMPLANT
SEPRAFILM MEMBRANE 5X6 (MISCELLANEOUS) IMPLANT
SHEET LAVH (DRAPES) ×2 IMPLANT
SPONGE GAUZE 4X4 12PLY STER LF (GAUZE/BANDAGES/DRESSINGS) ×2 IMPLANT
SPONGE LAP 18X18 X RAY DECT (DISPOSABLE) ×4 IMPLANT
STAPLER VISISTAT 35W (STAPLE) IMPLANT
STRIP CLOSURE SKIN 1/2X4 (GAUZE/BANDAGES/DRESSINGS) ×1 IMPLANT
SUT MNCRL AB 3-0 PS2 27 (SUTURE) ×1 IMPLANT
SUT MON AB 2-0 CT1 27 (SUTURE) ×2 IMPLANT
SUT MON AB 3-0 SH 27 (SUTURE)
SUT MON AB 3-0 SH27 (SUTURE) IMPLANT
SUT PDS AB 0 CT1 27 (SUTURE) ×4 IMPLANT
SUT PLAIN 2 0 XLH (SUTURE) IMPLANT
SUT VIC AB 0 CT1 27 (SUTURE) ×6
SUT VIC AB 0 CT1 27XCR 8 STRN (SUTURE) ×3 IMPLANT
SUT VICRYL 0 TIES 12 18 (SUTURE) ×2 IMPLANT
SYR CONTROL 10ML LL (SYRINGE) ×1 IMPLANT
TOWEL OR 17X24 6PK STRL BLUE (TOWEL DISPOSABLE) ×4 IMPLANT
TRAY FOLEY CATH 14FR (SET/KITS/TRAYS/PACK) ×2 IMPLANT
WATER STERILE IRR 1000ML POUR (IV SOLUTION) ×2 IMPLANT

## 2014-02-04 NOTE — Addendum Note (Signed)
Addendum created 02/04/14 1222 by Lyn Hollingshead, MD   Modules edited: Notes Section   Notes Section:  File: [id number]

## 2014-02-04 NOTE — Addendum Note (Signed)
Addendum created 02/04/14 1641 by Jonna Munro, CRNA   Modules edited: Notes Section   Notes Section:  File: [id number]

## 2014-02-04 NOTE — Anesthesia Postprocedure Evaluation (Addendum)
Anesthesia Post Note  Patient: Connie Miller  Procedure(s) Performed: Procedure(s) (LRB): TOTAL ABDOMINAL HYSTERECTOMY, BILATERAL SALPINGO OOPHORECTOMY  (Bilateral)  Anesthesia type: General  Patient location: PACU  Post pain: Pain level controlled  Post assessment: Post-op Vital signs reviewed  Last Vitals:  Filed Vitals:   02/04/14 1200  BP: 103/55  Pulse: 65  Temp: 36.8 C  Resp: 16    Post vital signs: Reviewed  Level of consciousness: sedated  Complications: No apparent anesthesia complications

## 2014-02-04 NOTE — Anesthesia Preprocedure Evaluation (Signed)
Anesthesia Evaluation  Patient identified by MRN, date of birth, ID band Patient awake    Reviewed: Allergy & Precautions, H&P , NPO status , Patient's Chart, lab work & pertinent test results  Airway Mallampati: I TM Distance: >3 FB Neck ROM: full    Dental no notable dental hx. (+) Teeth Intact   Pulmonary neg pulmonary ROS,    Pulmonary exam normal       Cardiovascular negative cardio ROS      Neuro/Psych negative psych ROS   GI/Hepatic negative GI ROS, Neg liver ROS,   Endo/Other  negative endocrine ROS  Renal/GU negative Renal ROS     Musculoskeletal   Abdominal Normal abdominal exam  (+)   Peds  Hematology negative hematology ROS (+)   Anesthesia Other Findings   Reproductive/Obstetrics negative OB ROS                           Anesthesia Physical Anesthesia Plan  ASA: II  Anesthesia Plan: General   Post-op Pain Management:    Induction: Intravenous  Airway Management Planned: Oral ETT  Additional Equipment:   Intra-op Plan:   Post-operative Plan: Extubation in OR  Informed Consent: I have reviewed the patients History and Physical, chart, labs and discussed the procedure including the risks, benefits and alternatives for the proposed anesthesia with the patient or authorized representative who has indicated his/her understanding and acceptance.   Dental Advisory Given  Plan Discussed with: CRNA and Surgeon  Anesthesia Plan Comments:         Anesthesia Quick Evaluation

## 2014-02-04 NOTE — Op Note (Signed)
Hysterectomy Procedure Note  Indications: Hematometra, cervical stenosis in the setting of tamoxifen  Pre-operative Diagnosis: Hematometra, cervical stenosis in the setting of tamoxifen  Post-operative Diagnosis: Same  Operation: Total abdominal hysterectomy, bilateral salpingo-oophorectomy  Surgeon: [name]   Assistants: Baltazar Najjar, MD  Anesthesia: General endotracheal anesthesia  ASA Class: 1  Procedure Details  The patient was seen in the Holding Room. The risks, benefits, complications, treatment options, and expected outcomes were discussed with the patient.  The patient concurred with the proposed plan, giving informed consent.  The site of surgery properly noted/marked. The patient was taken to Operating Room # 7, identified as Connie Miller and the procedure verified as Total abdominal hysterectomy, bilateral salpingo-oophorectomy. A Time Out was held and the above information confirmed.  Patient brought to the operating room and after satisfactory attainment of general anesthesia was placed in a modified lithotomy position in [place]. Anterior abdominal wall perineum and vagina were prepped a Foley catheter was inserted the patient was draped. Antibiotics were administered. The abdomen was entered through a transverse incision. The upper abdomen and the pelvis were explored with the findings noted below. An [employer] retractor was positioned and the small bowel packed out of the pelvis. The left retroperitoneal space was opened and the ureter and vessels identified. The ovarian vessels were skeletonized.  The left infundibulo-pelvic ligament was grasped, cut and suture ligated with 0-Vicryl.clamped cut free tied and suture ligated.    Attention was turned to the right side of the pelvis. The right retroperitoneal space was opened and the ovarian vessel skeletonized. The right infundibulo-pelvic ligament was grasped, cut, and suture ligated with  0-Vicryl.clamped, cut free tied and suture ligated. The bladder flap was advanced with sharp and blunt dissection. In a stepwise fashion the uterine vessels were skeletonized clamped cut and suture ligated. The cardinal ligaments and paracervical tissues were clamped cut and suture ligated. The vaginal angles were crossclamped and the vagina transected from its connection to the cervix. The vaginal angles were transfixed with 0 Vicryl. The central portion vagina closed the interrupted sutures of 0 Vicryl. The pelvis is irrigated. Hemostasis achieved with cautery.  The parieto peritoneum was closed in a running fashion with 2-0 Monocryl.  The fascia was closed in a running fashion with 0-Vicryl.  Subcutaneous tissue was irrigated and hemostasis was achieved with cautery.  The subcutaneous layer was infiltrated with Exparel.  The skin closed in a subcuticular fashion. A dressing was applied. Patient was awakened from anesthesia and taken to recovery in satisfactory condition sponge needle and instrument counts correct x2   Findings: Normal uterus, fallopian tubes and ovaries  Estimated Blood Loss:  less than 100 mL         Drains: None         Total IV Fluids: per Anesthesiology         Specimens: to Pathology         Implants: None         Complications:  None; patient tolerated the procedure well.         Disposition: PACU - hemodynamically stable.         Condition: stable

## 2014-02-04 NOTE — Anesthesia Postprocedure Evaluation (Signed)
  Anesthesia Post-op Note  Patient: Connie Miller  Procedure(s) Performed: Procedure(s): TOTAL ABDOMINAL HYSTERECTOMY, BILATERAL SALPINGO OOPHORECTOMY  (Bilateral)  Patient Location: PACU  Anesthesia Type:General  Level of Consciousness: awake, alert  and oriented  Airway and Oxygen Therapy: Patient Spontanous Breathing  Post-op Pain: none  Post-op Assessment: Post-op Vital signs reviewed, Patient's Cardiovascular Status Stable and Respiratory Function Stable  Post-op Vital Signs: Reviewed and stable  Last Vitals:  Filed Vitals:   02/04/14 1330  BP: 111/66  Pulse: 69  Temp: 36.4 C  Resp: 18    Complications: No apparent anesthesia complications

## 2014-02-04 NOTE — Transfer of Care (Signed)
Immediate Anesthesia Transfer of Care Note  Patient: Connie Miller  Procedure(s) Performed: Procedure(s): TOTAL ABDOMINAL HYSTERECTOMY, BILATERAL SALPINGO OOPHORECTOMY  (Bilateral)  Patient Location: PACU  Anesthesia Type:General  Level of Consciousness: awake, alert , oriented and patient cooperative  Airway & Oxygen Therapy: Patient Spontanous Breathing and Patient connected to nasal cannula oxygen  Post-op Assessment: Report given to PACU RN and Post -op Vital signs reviewed and stable  Post vital signs: Reviewed and stable  Complications: No apparent anesthesia complications

## 2014-02-04 NOTE — Interval H&P Note (Signed)
History and Physical Interval Note:  02/04/2014 8:55 AM  Connie Miller  has presented today for surgery, with the diagnosis of cervical stenosis  hematometra  The various methods of treatment have been discussed with the patient and family. After consideration of risks, benefits and other options for treatment, the patient has consented to  Procedure(s): TOTAL ABDOMINAL HYSTERECTOMY, BILATERAL SALPINGO OOPHORECTOMY  (Bilateral) as a surgical intervention .  The patient's history has been reviewed, patient examined, no change in status, stable for surgery.  I have reviewed the patient's chart and labs.  Questions were answered to the patient's satisfaction.     [name]

## 2014-02-05 LAB — BASIC METABOLIC PANEL
BUN: 8 mg/dL (ref 6–23)
CO2: 27 mEq/L (ref 19–32)
Calcium: 8.6 mg/dL (ref 8.4–10.5)
Chloride: 94 mEq/L — ABNORMAL LOW (ref 96–112)
Creatinine, Ser: 0.57 mg/dL (ref 0.50–1.10)
GFR calc Af Amer: >90 mL/min (ref >90)
GFR calc non Af Amer: >90 mL/min (ref >90)
Glucose, Bld: 122 mg/dL — ABNORMAL HIGH (ref 70–99)
Potassium: 4 mEq/L (ref 3.7–5.3)
Sodium: 131 mEq/L — ABNORMAL LOW (ref 137–147)

## 2014-02-05 LAB — CBC
HCT: 34 % — ABNORMAL LOW (ref 36.0–46.0)
Hemoglobin: 11.2 g/dL — ABNORMAL LOW (ref 12.0–15.0)
MCH: 30.4 pg (ref 26.0–34.0)
MCHC: 32.9 g/dL (ref 30.0–36.0)
MCV: 92.4 fL (ref 78.0–100.0)
Platelets: 190 10*3/uL (ref 150–400)
RBC: 3.68 MIL/uL — ABNORMAL LOW (ref 3.87–5.11)
RDW: 13.5 % (ref 11.5–15.5)
WBC: 10.9 10*3/uL — ABNORMAL HIGH (ref 4.0–10.5)

## 2014-02-05 MED ORDER — IBUPROFEN 600 MG PO TABS
600.0000 mg | ORAL_TABLET | Freq: Four times a day (QID) | ORAL | Status: DC
  Administered 2014-02-05 – 2014-02-06 (×4): 600 mg via ORAL
  Filled 2014-02-05 (×4): qty 1

## 2014-02-05 NOTE — Progress Notes (Signed)
1 Day Post-Op Procedure(s) (LRB): TOTAL ABDOMINAL HYSTERECTOMY, BILATERAL SALPINGO OOPHORECTOMY  (Bilateral)  Subjective: Patient reports no complaints   Objective: Vital signs in last 24 hours: Temp:  [97.2 F (36.2 C)-98.4 F (36.9 C)] 98.4 F (36.9 C) (05/30 1000) Pulse Rate:  [57-96] 80 (05/30 1000) Resp:  [12-19] 18 (05/30 1000) BP: (98-117)/(53-66) 99/66 mmHg (05/30 1000) SpO2:  [96 %-100 %] 99 % (05/30 1000) Weight:  [51.71 kg (114 lb)] 51.71 kg (114 lb) (05/29 1215) Last BM Date: 02/04/14  Intake/Output from previous day: 05/29 0701 - 05/30 0700 In: 5063.4 [P.O.:1080; I.V.:3983.4] Out: 1325 [Urine:1275; Blood:50] Intake/Output this shift: Total I/O In: 960 [P.O.:960] Out: 1200 [Urine:1200]  Physical Examination:  General: alert Resp: clear to auscultation bilaterally Cardio: regular rate and rhythm, S1, S2 normal, no murmur, click, rub or gallop GI: soft, non-tender; bowel sounds normal; no masses,  no organomegaly and incision: clean, dry and intact Extremities: extremities normal, atraumatic, no cyanosis or edema and Homans sign is negative, no sign of DVT Vaginal Bleeding: none   Labs:  WBC/Hgb/Hct/Plts:  10.9/11.2/34.0/190 (05/30 0530) BUN/Cr/glu/ALT/AST/amyl/lip:  8/0.57/--/--/--/--/-- (05/30 0530)  Assessment:  58 y.o. s/p Procedure(s): TOTAL ABDOMINAL HYSTERECTOMY, BILATERAL SALPINGO OOPHORECTOMY : stable  Pain: Pain is well-controlled on oral medications.  GI:  Tolerating po: Yes    Prophylaxis: intermittent pneumatic compression boots.  Plan: Advance diet Encourage ambulation Discontinue IV fluids   LOS: 1 day     [name] 02/05/2014, 10:21 AM

## 2014-02-06 MED ORDER — OXYCODONE-ACETAMINOPHEN 5-325 MG PO TABS: 2.0000 | ORAL_TABLET | Freq: Four times a day (QID) | ORAL | Status: DC | PRN

## 2014-02-06 NOTE — Discharge Summary (Signed)
Physician Discharge Summary  Patient ID: Connie Miller MRN: [id number] DOB/AGE: 1955/10/16 58 y.o.  Admit date: 02/04/2014 Discharge date: 02/06/2014  Admission Diagnoses: Active Problems:   Hematometra  Discharge Diagnoses:  Active Problems:   Hematometra   Discharged Condition: good  Hospital Course: On 02/04/2014, the patient underwent the following: Procedure(s): TOTAL ABDOMINAL HYSTERECTOMY, BILATERAL SALPINGO OOPHORECTOMY .   The postoperative course was uneventful.  She was discharged to home on postoperative day 2 tolerating a regular diet.  Consults: None  Significant Diagnostic Studies: none  Treatments: surgery: see above  Discharge Exam: Blood pressure 111/64, pulse 75, temperature 97.9 F (36.6 C), temperature source Oral, resp. rate 18, height 5\' 2"  (1.575 m), weight 51.71 kg (114 lb), SpO2 99.00%. General appearance: alert Resp: clear to auscultation bilaterally Cardio: regular rate and rhythm, S1, S2 normal, no murmur, click, rub or gallop GI: soft, non-tender; bowel sounds normal; no masses,  no organomegaly Pelvic: no bleeding Extremities: extremities normal, atraumatic, no cyanosis or edema and Homans sign is negative, no sign of DVT I: C/D/I Disposition: 01-Home or Self Care  Discharge Instructions   Call MD for:  extreme fatigue    Complete by:  As directed      Call MD for:  persistant dizziness or light-headedness    Complete by:  As directed      Call MD for:  persistant nausea and vomiting    Complete by:  As directed      Call MD for:  redness, tenderness, or signs of infection (pain, swelling, redness, odor or green/yellow discharge around incision site)    Complete by:  As directed      Call MD for:  severe uncontrolled pain    Complete by:  As directed      Call MD for:  temperature >100.4    Complete by:  As directed      Diet - low sodium heart healthy    Complete by:  As directed      Discharge wound care:    Complete by:  As  directed   Keep clean and dry     Driving Restrictions    Complete by:  As directed   No driving for 1 week     Increase activity slowly    Complete by:  As directed      Lifting restrictions    Complete by:  As directed   No lifting > 5 lbs for 6 weeks     May shower / Bathe    Complete by:  As directed   No tub baths for 6 weeks     May walk up steps    Complete by:  As directed      Sexual Activity Restrictions    Complete by:  As directed   No intercourse for 6  weeks            Medication List         aspirin 81 MG tablet  Take 81 mg by mouth at bedtime.     CENTRUM SILVER PO  Take 1 tablet by mouth daily.     oxyCODONE-acetaminophen 5-325 MG per tablet  Commonly known as:  PERCOCET  Take 2 tablets by mouth every 6 (six) hours as needed for severe pain.     pravastatin 20 MG tablet  Commonly known as:  PRAVACHOL  Take 20 mg by mouth daily.     SUMAtriptan 100 MG tablet  Commonly known as:  IMITREX  Take 100 mg by mouth as needed for migraine.     tamoxifen 20 MG tablet  Commonly known as:  NOLVADEX  TAKE 1 TABLET (20 MG TOTAL) BY MOUTH DAILY.           Follow-up Information   Follow up with Agnes Lawrence, MD. Schedule an appointment as soon as possible for a visit in 2 weeks. (postop f/u)    Specialty:  Obstetrics and Gynecology   Contact information:   [address] [place] [address] [phone]       Signed: [name] 02/06/2014, 10:57 AM

## 2014-02-06 NOTE — Discharge Instructions (Signed)
Hysterectomy °Care After °Refer to this sheet in the next few weeks. These instructions provide you with information on caring for yourself after your procedure. Your caregiver may also give you more specific instructions. Your treatment has been planned according to current medical practices, but problems sometimes occur. Call your caregiver if you have any problems or questions after your procedure. °HOME CARE INSTRUCTIONS  °Healing will take time. You may have discomfort, tenderness, swelling, and bruising at the surgical site for about 2 weeks. This is normal and will get better as time goes on. °· Only take over-the-counter or prescription medicines for pain, discomfort, or fever as directed by your caregiver. °· Do not take aspirin. It can cause bleeding. °· Do not drive when taking pain medicine. °· Follow your caregiver's advice regarding exercise, lifting, driving, and general activities. °· Resume your usual diet as directed and allowed. °· Get plenty of rest and sleep. °· Do not douche, use tampons, or have sexual intercourse for at least 6 weeks or until your caregiver gives you permission. °· Change your bandages (dressings) as directed by your caregiver. °· Monitor your temperature. °· Take showers instead of baths for 2 to 3 weeks. °· Do not drink alcohol until your caregiver gives you permission. °· If you are constipated, you may take a mild laxative with your caregiver's permission. Bran foods may help with constipation problems. Drinking enough fluids to keep your urine clear or pale yellow may help as well. °· Try to have someone home with you for 1 or 2 weeks to help around the house. °· Keep all of your follow-up appointments as directed by your caregiver. °SEEK MEDICAL CARE IF:  °· You have swelling, redness, or increasing pain in the surgical cut (incision) area. °· You have pus coming from the incision. °· You notice a bad smell coming from the incision or dressing. °· You have swelling,  redness, or pain around the intravenous (IV) site. °· Your incision breaks open. °· You feel dizzy or lightheaded. °· You have pain or bleeding when you urinate. °· You have persistent diarrhea. °· You have persistent nausea and vomiting. °· You have abnormal vaginal discharge. °· You have a rash. °· You have any type of abnormal reaction or develop an allergy to your medicine. °· Your pain is not controlled with your prescribed medicine. °SEEK IMMEDIATE MEDICAL CARE IF:  °· You have a fever. °· You have severe abdominal pain. °· You have chest pain. °· You have shortness of breath. °· You faint. °· You have pain, swelling, or redness of your leg. °· You have heavy vaginal bleeding with blood clots. °MAKE SURE YOU: °· Understand these instructions. °· Will watch your condition. °· Will get help right away if you are not doing well or get worse. °Document Released: 03/15/2005 Document Revised: 11/18/2011 Document Reviewed: 04/12/2011 °[employer]® Patient Information ©2014 [employer]. ° °

## 2014-02-06 NOTE — Progress Notes (Signed)
Discharge instructions reviewed with patient and significant other.  Both state understanding of activity, medications, follow up appointments, when to call the doctor and community resources.  All questions answered.  Patient ambulated in stable condition with personal belongings and prescriptions accompanied by staff without incident.

## 2014-02-07 ENCOUNTER — Encounter (HOSPITAL_COMMUNITY): Payer: Self-pay | Admitting: Obstetrics & Gynecology

## 2014-02-11 ENCOUNTER — Encounter: Payer: Self-pay | Admitting: *Deleted

## 2014-02-21 ENCOUNTER — Ambulatory Visit (INDEPENDENT_AMBULATORY_CARE_PROVIDER_SITE_OTHER): Payer: PRIVATE HEALTH INSURANCE | Admitting: Obstetrics & Gynecology

## 2014-02-21 ENCOUNTER — Encounter: Payer: Self-pay | Admitting: Obstetrics & Gynecology

## 2014-02-21 VITALS — BP 97/63 | HR 65 | Temp 98.6°F | Wt 109.0 lb

## 2014-02-21 DIAGNOSIS — Z09 Encounter for follow-up examination after completed treatment for conditions other than malignant neoplasm: Secondary | ICD-10-CM

## 2014-02-21 NOTE — Progress Notes (Signed)
Subjective:     Connie Miller is a 58 y.o. female who presents to the clinic 2 weeks status post total abdominal hysterectomy for abnormal uterine bleeding. Eating a regular diet without difficulty. Bowel movements are normal. Pain is controlled without any medications.  The following portions of the patient's history were reviewed and updated as appropriate: allergies, current medications, past family history, past medical history, past social history, past surgical history and problem list.  Review of Systems Pertinent items are noted in HPI.    Objective:    BP 97/63  Pulse 65  Temp(Src) 98.6 F (37 C)  Wt 49.442 kg (109 lb) General:  alert  Abdomen: soft, bowel sounds active, non-tender  Incision:   healing well, no drainage, no erythema, no hernia, no seroma, no swelling, no dehiscence, incision well approximated       Assessment:    Doing well postoperatively. Operative findings again reviewed. Pathology report discussed.    Plan:    1. Continue any current medications. 2. Wound care discussed. 3. Activity restrictions: no bending, stooping, or squatting, no lifting more than 5 pounds and no intercourse 4. Anticipated return to work: 4 weeks. 5. Follow up: 1 month.

## 2014-03-23 ENCOUNTER — Encounter: Payer: Self-pay | Admitting: Obstetrics & Gynecology

## 2014-03-23 ENCOUNTER — Ambulatory Visit (INDEPENDENT_AMBULATORY_CARE_PROVIDER_SITE_OTHER): Payer: PRIVATE HEALTH INSURANCE | Admitting: Obstetrics & Gynecology

## 2014-03-23 VITALS — BP 122/82 | HR 80 | Temp 98.2°F | Wt 110.0 lb

## 2014-03-23 DIAGNOSIS — A499 Bacterial infection, unspecified: Secondary | ICD-10-CM

## 2014-03-23 DIAGNOSIS — B9689 Other specified bacterial agents as the cause of diseases classified elsewhere: Secondary | ICD-10-CM

## 2014-03-23 DIAGNOSIS — N760 Acute vaginitis: Secondary | ICD-10-CM

## 2014-03-23 DIAGNOSIS — Z09 Encounter for follow-up examination after completed treatment for conditions other than malignant neoplasm: Secondary | ICD-10-CM

## 2014-03-23 MED ORDER — METRONIDAZOLE 500 MG PO TABS: 500.0000 mg | ORAL_TABLET | Freq: Two times a day (BID) | ORAL | Status: DC

## 2014-03-25 NOTE — Progress Notes (Signed)
Subjective:     Connie Miller is a 58 y.o. female who presents to the clinic 6 weeks status post total abdominal hysterectomy for abnormal uterine bleeding. Eating a regular diet without difficulty. Bowel movements are normal. The patient is not having any pain.  The following portions of the patient's history were reviewed and updated as appropriate: allergies, current medications, past family history, past medical history, past social history, past surgical history and problem list.  Review of Systems Pertinent items are noted in HPI.    Objective:    BP 122/82  Pulse 80  Temp(Src) 98.2 F (36.8 C)  Wt 49.896 kg (110 lb) General:  alert  Abdomen: soft, bowel sounds active, non-tender  Incision:   healing well, no drainage, no erythema, no hernia, no seroma, no swelling, no dehiscence, incision well approximated    Pelvic: vaginal cuff intact, no adnexal masses, copious thick, yellow discharge   Assessment:    Doing well postoperatively. Vaginitis Operative findings again reviewed. Pathology report discussed.    Plan:   Meds ordered this encounter  Medications  . metroNIDAZOLE (FLAGYL) 500 MG tablet    Sig: Take 1 tablet (500 mg total) by mouth 2 (two) times daily.    Dispense:  14 tablet    Refill:  0    1. Continue any current medications. 2. Wound care discussed. 3. Activity restrictions: none 4. Anticipated return to work: now. 5. Follow up: 6 weeks.

## 2014-03-25 NOTE — Patient Instructions (Signed)

## 2014-04-04 ENCOUNTER — Ambulatory Visit (INDEPENDENT_AMBULATORY_CARE_PROVIDER_SITE_OTHER): Payer: PRIVATE HEALTH INSURANCE | Admitting: General Surgery

## 2014-05-04 ENCOUNTER — Ambulatory Visit (INDEPENDENT_AMBULATORY_CARE_PROVIDER_SITE_OTHER): Payer: PRIVATE HEALTH INSURANCE | Admitting: Obstetrics & Gynecology

## 2014-05-04 DIAGNOSIS — N760 Acute vaginitis: Secondary | ICD-10-CM

## 2014-05-05 ENCOUNTER — Ambulatory Visit (INDEPENDENT_AMBULATORY_CARE_PROVIDER_SITE_OTHER): Payer: PRIVATE HEALTH INSURANCE | Admitting: Obstetrics & Gynecology

## 2014-05-05 DIAGNOSIS — N760 Acute vaginitis: Secondary | ICD-10-CM

## 2014-05-08 ENCOUNTER — Encounter: Payer: Self-pay | Admitting: Obstetrics & Gynecology

## 2014-05-08 NOTE — Progress Notes (Signed)
Patient ID: [name], female   DOB: 03-03-1956, 58 y.o.   MRN: [id number]  Chief Complaint  Patient presents with  . Follow-up    HPI JASSMINE VANDRUFF is a 58 y.o. female.  C/O yellow vaginal discharge  HPI  Past Medical History  Diagnosis Date  . Ectopic pregnancy   . Cervical polyp Feb. 2011    benign  . Migraine     1 every 6 weeks. food triggered/hormonal  . Fibroid uterus 2007  . 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 Breast cancer, stage 0   . Hyperlipidemia     Past Surgical History  Procedure Laterality Date  . Ectopic pregnancy surgery  August 1997    Laparoscopy for ectopic  . Cervical polypectomy  feb. 2011  . Skin cancer excision  1996    removal of small spot on eyelid, noninvasive (basal cell) skin cancer  . Breast lumpectomy      right breast lumpectomy, snbx  . Hysteroscopy w/d&c N/A 11/05/2013    Procedure: DILATATION AND CURETTAGE ;  Surgeon: [name], MD;  Location: [facility];  Service: Gynecology;  Laterality: N/A;   with Ultrasound guidance  . Dilation and curettage of uterus      several d &c - polyps and bleeding  . Tonsillectomy    . Wisdom tooth extraction    . Abdominal hysterectomy Bilateral 02/04/2014    Procedure: TOTAL ABDOMINAL HYSTERECTOMY, BILATERAL SALPINGO OOPHORECTOMY ;  Surgeon: [name], MD;  Location: [facility];  Service: Gynecology;  Laterality: Bilateral;    Family History  Problem Relation Age of Onset  . Heart disease Father   . Early death Cousin     Social History History  Substance Use Topics  . Smoking status: Never Smoker   . Smokeless tobacco: Never Used  . Alcohol Use: No    No Known Allergies  Current Outpatient Prescriptions  Medication Sig Dispense Refill  . aspirin 81 MG tablet Take 81 mg by mouth at bedtime.       . metroNIDAZOLE (FLAGYL) 500 MG tablet Take 1 tablet (500 mg total) by mouth 2 (two) times daily.  14 tablet  0  . Multiple Vitamins-Minerals (CENTRUM SILVER PO) Take 1  tablet by mouth daily.        . pravastatin (PRAVACHOL) 20 MG tablet Take 20 mg by mouth daily.      . SUMAtriptan (IMITREX) 100 MG tablet Take 100 mg by mouth as needed for migraine.       . tamoxifen (NOLVADEX) 20 MG tablet TAKE 1 TABLET (20 MG TOTAL) BY MOUTH DAILY.  90 tablet  3   No current facility-administered medications for this visit.    Review of Systems Review of Systems Constitutional: negative for fatigue and weight loss Respiratory: negative for cough and wheezing Cardiovascular: negative for chest pain, fatigue and palpitations Gastrointestinal: negative for abdominal pain and change in bowel habits Genitourinary: positive for vaginal discharge Integument/breast: negative for nipple discharge Musculoskeletal:negative for myalgias Neurological: negative for gait problems and tremors Behavioral/Psych: negative for abusive relationship, depression Endocrine: negative for temperature intolerance     There were no vitals taken for this visit.  Physical Exam Physical Exam General:   alert  Skin:   no rash or abnormalities  Abdomen:  normal findings: no organomegaly, soft, non-tender and no hernia  Pelvis:  External genitalia: normal general appearance Urinary system: urethral meatus normal and bladder without fullness, nontender Vaginal: thick, yellow discharge;  vaginal cuff non-tender  Adnexa: normal bimanual exam     Sample of the vaginal discharge-->Medical diagnostics   Data Reviewed None  Assessment    Vaginitis, persistent/recurrent     Plan    Possible management options include: review testing results Follow up as needed.         JACKSON-MOORE,Yan Okray A 05/08/2014, 9:36 PM

## 2014-05-09 ENCOUNTER — Encounter: Payer: Self-pay | Admitting: Obstetrics & Gynecology

## 2014-05-09 NOTE — Progress Notes (Signed)
Vaginal discharge-->Medical Diagnostics

## 2014-05-17 ENCOUNTER — Telehealth: Payer: Self-pay | Admitting: Hematology and Oncology

## 2014-05-17 NOTE — Telephone Encounter (Signed)
, °

## 2014-05-20 ENCOUNTER — Telehealth: Payer: Self-pay | Admitting: *Deleted

## 2014-05-20 NOTE — Telephone Encounter (Signed)
Tried to contact patient regarding lab results on 05/19/2014 at 6:35PM  LM on VM to CB. Patient CB on 05/20/2014.  CB: 12:30PM, Patient notified of lab results.

## 2014-05-25 ENCOUNTER — Other Ambulatory Visit: Payer: Self-pay

## 2014-05-25 DIAGNOSIS — D0590 Unspecified type of carcinoma in situ of unspecified breast: Secondary | ICD-10-CM

## 2014-05-26 ENCOUNTER — Ambulatory Visit: Payer: PRIVATE HEALTH INSURANCE | Admitting: Oncology

## 2014-05-26 ENCOUNTER — Encounter: Payer: Self-pay | Admitting: Hematology and Oncology

## 2014-05-26 ENCOUNTER — Other Ambulatory Visit: Payer: PRIVATE HEALTH INSURANCE

## 2014-05-26 ENCOUNTER — Telehealth: Payer: Self-pay | Admitting: Hematology and Oncology

## 2014-05-26 ENCOUNTER — Other Ambulatory Visit (HOSPITAL_BASED_OUTPATIENT_CLINIC_OR_DEPARTMENT_OTHER): Payer: PRIVATE HEALTH INSURANCE

## 2014-05-26 ENCOUNTER — Ambulatory Visit (HOSPITAL_BASED_OUTPATIENT_CLINIC_OR_DEPARTMENT_OTHER): Payer: PRIVATE HEALTH INSURANCE | Admitting: Hematology and Oncology

## 2014-05-26 VITALS — BP 131/91 | HR 76 | Temp 98.2°F | Resp 20 | Ht 62.0 in | Wt 109.3 lb

## 2014-05-26 DIAGNOSIS — D0590 Unspecified type of carcinoma in situ of unspecified breast: Secondary | ICD-10-CM

## 2014-05-26 DIAGNOSIS — Z853 Personal history of malignant neoplasm of breast: Secondary | ICD-10-CM

## 2014-05-26 LAB — CBC WITH DIFFERENTIAL/PLATELET
BASO%: 1.2 % (ref 0.0–2.0)
Basophils Absolute: 0.1 10*3/uL (ref 0.0–0.1)
EOS%: 2.2 % (ref 0.0–7.0)
Eosinophils Absolute: 0.1 10*3/uL (ref 0.0–0.5)
HCT: 40.8 % (ref 34.8–46.6)
HGB: 13.2 g/dL (ref 11.6–15.9)
LYMPH%: 38.2 % (ref 14.0–49.7)
MCH: 30.5 pg (ref 25.1–34.0)
MCHC: 32.2 g/dL (ref 31.5–36.0)
MCV: 94.7 fL (ref 79.5–101.0)
MONO#: 0.4 10*3/uL (ref 0.1–0.9)
MONO%: 8.6 % (ref 0.0–14.0)
NEUT#: 2.4 10*3/uL (ref 1.5–6.5)
NEUT%: 49.8 % (ref 38.4–76.8)
Platelets: 228 10*3/uL (ref 145–400)
RBC: 4.31 10*6/uL (ref 3.70–5.45)
RDW: 13.7 % (ref 11.2–14.5)
WBC: 4.9 10*3/uL (ref 3.9–10.3)
lymph#: 1.9 10*3/uL (ref 0.9–3.3)

## 2014-05-26 LAB — COMPREHENSIVE METABOLIC PANEL (CC13)
ALT: 16 U/L (ref 0–55)
AST: 19 U/L (ref 5–34)
Albumin: 3.7 g/dL (ref 3.5–5.0)
Alkaline Phosphatase: 49 U/L (ref 40–150)
Anion Gap: 10 mEq/L (ref 3–11)
BUN: 10.3 mg/dL (ref 7.0–26.0)
CO2: 27 mEq/L (ref 22–29)
Calcium: 9.1 mg/dL (ref 8.4–10.4)
Chloride: 107 mEq/L (ref 98–109)
Creatinine: 0.7 mg/dL (ref 0.6–1.1)
Glucose: 88 mg/dl (ref 70–140)
Potassium: 3.4 mEq/L — ABNORMAL LOW (ref 3.5–5.1)
Sodium: 144 mEq/L (ref 136–145)
Total Bilirubin: 0.33 mg/dL (ref 0.20–1.20)
Total Protein: 6.8 g/dL (ref 6.4–8.3)

## 2014-05-26 NOTE — Assessment & Plan Note (Signed)
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  Patient is tolerating tamoxifen very well and plans to continue and finish of 5 years of therapy. Monitoring closely for tamoxifen-related toxicities.  Surveillance: Breast exam today did not reveal any lumps or nodules. I reviewed the mammogram done in March 2015 which was normal. I will see the patient back in April 2016 after undergoing a mammogram of both breasts.  Patient is very active with work and stays busy.

## 2014-05-26 NOTE — Telephone Encounter (Signed)
, °

## 2014-05-26 NOTE — Progress Notes (Signed)
Patient Care Team: Leanna Battles, MD as PCP - General (Internal Medicine)  DIAGNOSIS: No matching staging information was found for the patient.  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 She is tolerating tamoxifen fairly well without any major problems or concerns. She does have occasional depression and fatigue issues. Patient is working currently and is able to keep her mind off her history of DCIS.  REVIEW OF SYSTEMS:   Constitutional: Denies fevers, chills or abnormal weight loss, fatigue due to tamoxifen Eyes: Denies blurriness of vision Ears, nose, mouth, throat, and face: Denies mucositis or sore throat Respiratory: Denies cough, dyspnea or wheezes Cardiovascular: Denies palpitation, chest discomfort or lower extremity swelling Gastrointestinal:  Denies nausea, heartburn or change in bowel habits Skin: Denies abnormal skin rashes Lymphatics: Denies new lymphadenopathy or easy bruising Neurological:Denies numbness, tingling or new weaknesses Behavioral/Psych: Occasional depression symptoms  Breast:  denies any pain or lumps or nodules in either breasts All other systems were reviewed with the patient and are negative.  I have reviewed the past medical history, past surgical  history, social history and family history with the patient and they are unchanged from previous note.  ALLERGIES:  has No Known Allergies.  MEDICATIONS:  Current Outpatient Prescriptions  Medication Sig Dispense Refill  . aspirin 81 MG tablet Take 81 mg by mouth at bedtime.       . Multiple Vitamins-Minerals (CENTRUM SILVER PO) Take 1 tablet by mouth daily.        . pravastatin (PRAVACHOL) 20 MG tablet Take 20 mg by mouth daily.      . SUMAtriptan (IMITREX) 100 MG tablet Take 100 mg by mouth as needed for migraine.       . tamoxifen (NOLVADEX) 20 MG tablet TAKE 1 TABLET (20 MG TOTAL) BY MOUTH DAILY.  90 tablet  3   No current facility-administered medications for this visit.    PHYSICAL EXAMINATION: ECOG PERFORMANCE STATUS: 1 - Symptomatic but completely ambulatory  Filed Vitals:   05/26/14 0850  BP: 131/91  Pulse: 76  Temp: 98.2 F (36.8 C)  Resp: 20   Filed Weights   05/26/14 0850  Weight: 109 lb 4.8 oz (49.578 kg)    GENERAL:alert, no distress and comfortable SKIN: skin color, texture, turgor are normal, no rashes or significant lesions EYES: normal, Conjunctiva are pink and non-injected, sclera clear OROPHARYNX:no exudate, no erythema and lips, buccal mucosa, and tongue normal  NECK: supple, thyroid normal size, non-tender, without nodularity LYMPH:  no palpable lymphadenopathy in the cervical, axillary or inguinal LUNGS: clear to auscultation and percussion with normal breathing effort HEART: regular rate & rhythm and no murmurs and no lower extremity edema ABDOMEN:abdomen soft, non-tender and normal bowel sounds Musculoskeletal:no cyanosis of digits and no clubbing  NEURO: alert & oriented x 3 with fluent speech, no focal motor/sensory deficits BREAST: No palpable masses or  nodules in either right or left breasts. No palpable axillary supraclavicular or infraclavicular adenopathy no breast tenderness or nipple discharge.   LABORATORY DATA:  I have reviewed the  data as listed   Chemistry      Component Value Date/Time   NA 144 05/26/2014 0832   NA 131* 02/05/2014 0530   K 3.4* 05/26/2014 0832   K 4.0 02/05/2014 0530   CL 94* 02/05/2014 0530   CO2 27 05/26/2014 0832   CO2 27 02/05/2014 0530   BUN 10.3 05/26/2014 0832   BUN 8 02/05/2014 0530   CREATININE 0.7 05/26/2014 0832   CREATININE 0.57 02/05/2014 0530      Component Value Date/Time   CALCIUM 9.1 05/26/2014 0832   CALCIUM 8.6 02/05/2014 0530   ALKPHOS 49 05/26/2014 0832   ALKPHOS 41 11/10/2012 1318   AST 19 05/26/2014 0832   AST 19 11/10/2012 1318   ALT 16 05/26/2014 0832   ALT 20 11/10/2012 1318   BILITOT 0.33 05/26/2014 0832   BILITOT 0.3 11/10/2012 1318       Lab Results  Component Value Date   WBC 4.9 05/26/2014   HGB 13.2 05/26/2014   HCT 40.8 05/26/2014   MCV 94.7 05/26/2014   PLT 228 05/26/2014   NEUTROABS 2.4 05/26/2014     RADIOGRAPHIC STUDIES: I have personally reviewed the radiology reports and agreed with their findings. No results found.   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  Patient is tolerating tamoxifen very well and plans to continue and finish of 5 years of therapy. Monitoring closely for tamoxifen-related toxicities.  Surveillance: Breast exam today did not reveal any lumps or nodules. I reviewed the mammogram done in March 2015 which was normal. I will see the patient back in April 2016 after undergoing a mammogram of both breasts.  Patient is very active with work and stays busy.    Orders Placed This Encounter  Procedures  . MM Digital Diagnostic Bilat    Standing Status: Future     Number of Occurrences:      Standing Expiration Date: 05/26/2015    Order Specific Question:  Reason for Exam (SYMPTOM  OR DIAGNOSIS REQUIRED)    Answer:  H/O DCIS annual follow up    Order Specific Question:  Is the patient pregnant?    Answer:  No    Order Specific  Question:  Preferred imaging location?    Answer:  External     Comments:  [name]   The patient has a good understanding of the overall plan. she agrees with it. She will call with any problems that may develop before her next visit here.  I spent 20 minutes counseling the patient face to face. The total time spent in the appointment was 25 minutes and more than 50% was on counseling and review of test results    Rulon Eisenmenger, MD 05/26/2014 9:20 AM

## 2014-05-30 ENCOUNTER — Telehealth: Payer: Self-pay

## 2014-05-30 NOTE — Telephone Encounter (Signed)
LMOVM - let pt know potassium little low.  LC wants to try diet instead of supplements to manage.  Mailing patient a list of potassium rich foods.  Pt to call clinic if she has any questions.

## 2014-07-11 ENCOUNTER — Encounter: Payer: Self-pay | Admitting: Hematology and Oncology

## 2014-08-08 ENCOUNTER — Ambulatory Visit: Payer: PRIVATE HEALTH INSURANCE | Admitting: Obstetrics & Gynecology

## 2014-08-22 ENCOUNTER — Telehealth: Payer: Self-pay

## 2014-08-22 ENCOUNTER — Encounter: Payer: Self-pay | Admitting: Obstetrics & Gynecology

## 2014-08-22 ENCOUNTER — Ambulatory Visit (INDEPENDENT_AMBULATORY_CARE_PROVIDER_SITE_OTHER): Payer: PRIVATE HEALTH INSURANCE | Admitting: Obstetrics & Gynecology

## 2014-08-22 VITALS — BP 114/71 | HR 71 | Temp 97.7°F | Ht 62.0 in | Wt 105.0 lb

## 2014-08-22 DIAGNOSIS — F419 Anxiety disorder, unspecified: Secondary | ICD-10-CM

## 2014-08-22 DIAGNOSIS — N952 Postmenopausal atrophic vaginitis: Secondary | ICD-10-CM

## 2014-08-22 DIAGNOSIS — N898 Other specified noninflammatory disorders of vagina: Secondary | ICD-10-CM

## 2014-08-22 DIAGNOSIS — N904 Leukoplakia of vulva: Secondary | ICD-10-CM

## 2014-08-22 MED ORDER — CLOBETASOL PROPIONATE 0.05 % EX CREA: 1.0000 "application " | TOPICAL_CREAM | CUTANEOUS | Status: DC

## 2014-08-22 NOTE — Progress Notes (Signed)
Patient ID: Connie Miller, female   DOB: [birth year], 58 y.o.   MRN: [id number]  Chief Complaint  Patient presents with  . Follow-up    HPI Connie Miller is a 58 y.o. female.  HPI  Past Medical History  Diagnosis Date  . Ectopic pregnancy   . Cervical polyp Feb. 2011    benign  . Migraine     1 every 6 weeks. food triggered/hormonal  . Fibroid uterus 2007  . 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 Breast cancer, stage 0   . Hyperlipidemia     Past Surgical History  Procedure Laterality Date  . Ectopic pregnancy surgery  August 1997    Laparoscopy for ectopic  . Cervical polypectomy  feb. 2011  . Skin cancer excision  1996    removal of small spot on eyelid, noninvasive (basal cell) skin cancer  . Breast lumpectomy      right breast lumpectomy, snbx  . Hysteroscopy w/d&c N/A 11/05/2013    Procedure: DILATATION AND CURETTAGE ;  Surgeon: [name], MD;  Location: [facility];  Service: Gynecology;  Laterality: N/A;   with Ultrasound guidance  . Dilation and curettage of uterus      several d &c - polyps and bleeding  . Tonsillectomy    . Wisdom tooth extraction    . Abdominal hysterectomy Bilateral 02/04/2014    Procedure: TOTAL ABDOMINAL HYSTERECTOMY, BILATERAL SALPINGO OOPHORECTOMY ;  Surgeon: [name], MD;  Location: [facility];  Service: Gynecology;  Laterality: Bilateral;    Family History  Problem Relation Age of Onset  . Heart disease Father   . Early death Cousin     Social History History  Substance Use Topics  . Smoking status: Never Smoker   . Smokeless tobacco: Never Used  . Alcohol Use: No    No Known Allergies  Current Outpatient Prescriptions  Medication Sig Dispense Refill  . aspirin 81 MG tablet Take 81 mg by mouth at bedtime.     . Multiple Vitamins-Minerals (CENTRUM SILVER PO) Take 1 tablet by mouth daily.      . pravastatin (PRAVACHOL) 20 MG tablet Take 20 mg by mouth daily.    . SUMAtriptan (IMITREX) 100 MG tablet Take 100 mg  by mouth as needed for migraine.     . tamoxifen (NOLVADEX) 20 MG tablet TAKE 1 TABLET (20 MG TOTAL) BY MOUTH DAILY. 90 tablet 3  . clobetasol cream (TEMOVATE) 7.12 % Apply 1 application topically 2 (two) times a week. 30 g 2   No current facility-administered medications for this visit.    Review of Systems Review of Systems Constitutional: negative for fatigue and positive for  weight loss Respiratory: negative for cough and wheezing Cardiovascular: negative for chest pain, fatigue and palpitations Gastrointestinal: negative for abdominal pain and change in bowel habits Genitourinary: positive for vaginal discharge Integument/breast: negative for nipple discharge Musculoskeletal:negative for myalgias Neurological: negative for gait problems and tremors Behavioral/Psych: negative for abusive relationship, depression Endocrine: negative for temperature intolerance     Blood pressure 114/71, pulse 71, temperature 97.7 F (36.5 C), height 5\' 2"  (1.575 m), weight 47.628 kg (105 lb).  Physical Exam Physical Exam General:   alert  Skin:   no rash or abnormalities  Lungs:   clear to auscultation bilaterally  Heart:   regular rate and rhythm, S1, S2 normal, no murmur, click, rub or gallop  Abdomen:  normal findings: no organomegaly, soft, non-tender and no hernia  Pelvis:  External genitalia: normal general appearance Urinary system: urethral meatus normal and bladder without fullness, nontender Vaginal: thin yellow discharge Adnexa: normal bimanual exam       Data Reviewed None  Assessment    ?Atrophic vaginitis H/O vulva dystrophy Anxiety    Plan    Orders Placed This Encounter  Procedures  . Ambulatory referral to Psychology    Referral Priority:  Routine    Referral Type:  Psychiatric    Referral Reason:  Specialty Services Required    Requested Specialty:  Psychology    Number of Visits Requested:  1   Meds ordered this encounter  Medications  . clobetasol  cream (TEMOVATE) 0.05 %    Sig: Apply 1 application topically 2 (two) times a week.    Dispense:  30 g    Refill:  2    Possible management options include: r/o infectious etiology of vaginitis Follow up as needed or in 6 mths         JACKSON-MOORE,Kimla Furth A 08/24/2014, 5:53 PM

## 2014-08-22 NOTE — Telephone Encounter (Signed)
Connie Miller at [facility] said patient could call her directly for appt - let patient know that - patient said she would call her and set-up appt

## 2014-08-22 NOTE — Telephone Encounter (Signed)
Called Connie Miller's office at [phone] to find out what was needed for referral notes, etc - left message on her vm - will let patient know once she calls me back

## 2014-08-24 DIAGNOSIS — N952 Postmenopausal atrophic vaginitis: Secondary | ICD-10-CM | POA: Insufficient documentation

## 2014-09-05 ENCOUNTER — Encounter: Payer: Self-pay | Admitting: *Deleted

## 2014-09-06 ENCOUNTER — Encounter: Payer: Self-pay | Admitting: Obstetrics & Gynecology

## 2014-09-28 ENCOUNTER — Telehealth: Payer: Self-pay

## 2014-09-28 NOTE — Telephone Encounter (Signed)
Order faxed to [facility] for mammogram and ultrasound.  Sent to scan.   

## 2014-10-03 ENCOUNTER — Encounter: Payer: Self-pay | Admitting: Hematology and Oncology

## 2014-10-03 NOTE — Progress Notes (Signed)
Pt called regarding a bill she received for [facility] 05/26/14.  She stated her insurance company told her that Dr. Lindi Adie wasn't in network at the time of service.  I informed her that it stated that charges were applied to her deductible not that the provider wasn't in network.  She had more questions I couldn't answer so I advised that she call our billing department and gave her the number.  She wanted to know if there was someone local in our office she could talk to.  I informed her unfortunately that wouldn't be possible, our billing department is located in [place].  She didn't sound pleased but hopefully someone in billing will be able to resolve this problem for her.

## 2014-11-23 ENCOUNTER — Other Ambulatory Visit: Payer: Self-pay | Admitting: Oncology

## 2014-11-29 ENCOUNTER — Telehealth: Payer: Self-pay

## 2014-11-29 NOTE — Telephone Encounter (Signed)
Per Teola Bradley - pt is scheduled for mammogram on 12/07/14.

## 2014-12-07 ENCOUNTER — Telehealth: Payer: Self-pay | Admitting: *Deleted

## 2014-12-07 NOTE — Telephone Encounter (Signed)
Patient called wanting to know if she needed to have labs with her appt on 4/4. Spoke with Dr. Lindi Adie and called patient to let her know that labs were not needed. Patient verbalized understanding.

## 2014-12-12 ENCOUNTER — Telehealth: Payer: Self-pay | Admitting: Oncology

## 2014-12-12 ENCOUNTER — Ambulatory Visit (HOSPITAL_BASED_OUTPATIENT_CLINIC_OR_DEPARTMENT_OTHER): Payer: PRIVATE HEALTH INSURANCE | Admitting: Hematology and Oncology

## 2014-12-12 VITALS — BP 123/65 | HR 87 | Temp 98.0°F | Resp 19 | Ht 62.0 in | Wt 110.0 lb

## 2014-12-12 DIAGNOSIS — D0591 Unspecified type of carcinoma in situ of right breast: Secondary | ICD-10-CM | POA: Diagnosis not present

## 2014-12-12 NOTE — Progress Notes (Signed)
Patient Care Team: Leanna Battles, MD as PCP - General (Internal Medicine)  DIAGNOSIS: No matching staging information was found for the patient.  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 She also underwent radiation and is currently on tamoxifen from June 2012. She is completed nearly 4 years of therapy. She does not have any major problems tolerating tamoxifen. She had a recent mammogram that showed benign-appearing calcifications in the left breast.  REVIEW OF SYSTEMS:   Constitutional: Denies fevers, chills or abnormal weight loss Eyes: Denies blurriness of vision Ears, nose, mouth, throat, and face: Denies mucositis or sore throat Respiratory: Denies cough, dyspnea or wheezes Cardiovascular: Denies palpitation, chest discomfort or lower extremity swelling Gastrointestinal:  Denies nausea, heartburn or change in bowel habits Skin: Denies abnormal skin rashes Lymphatics: Denies new lymphadenopathy or easy bruising Neurological:Denies numbness, tingling or new weaknesses Behavioral/Psych: Mood is stable, no new changes  Breast:  denies any pain or lumps or nodules in either breasts All other systems were reviewed with the patient and are negative.  I have reviewed the past medical history, past surgical  history, social history and family history with the patient and they are unchanged from previous note.  ALLERGIES:  has No Known Allergies.  MEDICATIONS:  Current Outpatient Prescriptions  Medication Sig Dispense Refill  . aspirin 81 MG tablet Take 81 mg by mouth at bedtime.     . clobetasol cream (TEMOVATE) 2.99 % Apply 1 application topically 2 (two) times a week. 30 g 2  . Multiple Vitamins-Minerals (CENTRUM SILVER ADULT 50+ PO) Take by mouth.    . Multiple Vitamins-Minerals (CENTRUM SILVER PO) Take 1 tablet by mouth daily.      . pravastatin (PRAVACHOL) 20 MG tablet Take 20 mg by mouth daily.    . SUMAtriptan (IMITREX) 100 MG tablet Take 100 mg by mouth as needed for migraine.     . tamoxifen (NOLVADEX) 20 MG tablet TAKE 1 TABLET (20 MG TOTAL) BY MOUTH DAILY. 90 tablet 3  . Vitamin D, Ergocalciferol, (DRISDOL) 50000 UNITS CAPS capsule   4   No current facility-administered medications for this visit.    PHYSICAL EXAMINATION: ECOG PERFORMANCE STATUS: 0 - Asymptomatic  Filed Vitals:   12/12/14 0950  BP: 123/65  Pulse: 87  Temp: 98 F (36.7 C)  Resp: 19   Filed Weights   12/12/14 0950  Weight: 110 lb (49.896 kg)    GENERAL:alert, no distress and comfortable SKIN: skin color, texture, turgor are normal, no rashes or significant lesions EYES: normal, Conjunctiva are pink and non-injected, sclera clear OROPHARYNX:no exudate, no erythema and lips, buccal mucosa, and tongue normal  NECK: supple, thyroid normal size, non-tender, without nodularity LYMPH:  no palpable lymphadenopathy in the cervical, axillary or inguinal LUNGS: clear to auscultation and percussion with normal breathing effort HEART: regular rate & rhythm and no murmurs  and no lower extremity edema ABDOMEN:abdomen soft, non-tender and normal bowel sounds Musculoskeletal:no cyanosis of digits and no clubbing  NEURO: alert & oriented x 3 with fluent speech, no focal motor/sensory deficits BREAST: No palpable  masses or nodules in either right or left breasts. No palpable axillary supraclavicular or infraclavicular adenopathy no breast tenderness or nipple discharge. (exam performed in the presence of a chaperone)  LABORATORY DATA:  I have reviewed the data as listed   Chemistry      Component Value Date/Time   NA 144 05/26/2014 0832   NA 131* 02/05/2014 0530   K 3.4* 05/26/2014 0832   K 4.0 02/05/2014 0530   CL 94* 02/05/2014 0530   CO2 27 05/26/2014 0832   CO2 27 02/05/2014 0530   BUN 10.3 05/26/2014 0832   BUN 8 02/05/2014 0530   CREATININE 0.7 05/26/2014 0832   CREATININE 0.57 02/05/2014 0530      Component Value Date/Time   CALCIUM 9.1 05/26/2014 0832   CALCIUM 8.6 02/05/2014 0530   ALKPHOS 49 05/26/2014 0832   ALKPHOS 41 11/10/2012 1318   AST 19 05/26/2014 0832   AST 19 11/10/2012 1318   ALT 16 05/26/2014 0832   ALT 20 11/10/2012 1318   BILITOT 0.33 05/26/2014 0832   BILITOT 0.3 11/10/2012 1318       Lab Results  Component Value Date   WBC 4.9 05/26/2014   HGB 13.2 05/26/2014   HCT 40.8 05/26/2014   MCV 94.7 05/26/2014   PLT 228 05/26/2014   NEUTROABS 2.4 05/26/2014    ASSESSMENT & PLAN:  Cancer of right breast, stage 0 DCIS right breast status post lumpectomy 12/03/2010, radiation and currently on antiestrogen therapy with tamoxifen started 02/24/2011.  Tamoxifen toxicities: Patient is tolerating tamoxifen very well and plans to continue and finish of 5 years of therapy. Monitoring closely for tamoxifen-related toxicities.  Breast cancer Surveillance: Breast exam today did not reveal any lumps or nodules. Mammogram done March 2016 revealed left breast grouped calcifications. Six-month follow-up mammogram is being recommended. Patient is very anxious about it. I tried to reassure her that tamoxifen is meant to protect her from no lipping and other DCIS of breast cancer. However they do find an abnormality will be addressed before he becomes a problem. Patient felt  comforted.  Survivorship: Patient is very active with work and stays busy. Encouraged exercise regimen. Patient does dance and stays active. I encouraged her to do yoga to De-stress and it would help with her anxiety as well.  Return to clinic in 6 months for follow-up.    No orders of the defined types were placed in this encounter.   The patient has a good understanding of the overall plan. she agrees with it. She will call with any problems that may develop before her next visit here.   Rulon Eisenmenger, MD

## 2014-12-12 NOTE — Assessment & Plan Note (Signed)
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  Tamoxifen toxicities: Patient is tolerating tamoxifen very well and plans to continue and finish of 5 years of therapy. Monitoring closely for tamoxifen-related toxicities.  Breast cancer Surveillance: Breast exam today did not reveal any lumps or nodules. Mammogram done March 2016 revealed left breast grouped calcifications. Six-month follow-up mammogram is being recommended  Survivorship: Patient is very active with work and stays busy. Encouraged exercise regimen.  Return to clinic in 6 months for follow-up.

## 2014-12-12 NOTE — Telephone Encounter (Signed)
per pof to sch pt appt-gave pt copy of sch °

## 2014-12-22 ENCOUNTER — Encounter: Payer: Self-pay | Admitting: *Deleted

## 2014-12-22 NOTE — Progress Notes (Signed)
Received mammo report from [name], sent to scan. 

## 2015-06-18 NOTE — Assessment & Plan Note (Signed)
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  Tamoxifen toxicities: Patient is tolerating tamoxifen very well and plans to continue and finish of 5 years of therapy. Monitoring closely for tamoxifen-related toxicities.  Breast Cancer Surveillance: 1. Breast exam 06/19/15: Normal 2. Mammogram 12/07/14: Calcs Left Breast. 6 month follow up recommended. Breast density category D Discussed the differences between different breast density categories.

## 2015-06-19 ENCOUNTER — Ambulatory Visit (HOSPITAL_BASED_OUTPATIENT_CLINIC_OR_DEPARTMENT_OTHER): Payer: PRIVATE HEALTH INSURANCE | Admitting: Hematology and Oncology

## 2015-06-19 ENCOUNTER — Other Ambulatory Visit: Payer: Self-pay | Admitting: *Deleted

## 2015-06-19 ENCOUNTER — Telehealth: Payer: Self-pay | Admitting: Hematology and Oncology

## 2015-06-19 ENCOUNTER — Encounter: Payer: Self-pay | Admitting: Hematology and Oncology

## 2015-06-19 DIAGNOSIS — D0591 Unspecified type of carcinoma in situ of right breast: Secondary | ICD-10-CM | POA: Diagnosis not present

## 2015-06-19 DIAGNOSIS — Z79811 Long term (current) use of aromatase inhibitors: Secondary | ICD-10-CM | POA: Diagnosis not present

## 2015-06-19 MED ORDER — TAMOXIFEN CITRATE 20 MG PO TABS: ORAL_TABLET | ORAL | Status: DC

## 2015-06-19 NOTE — Telephone Encounter (Signed)
Appointments made and avs printed for patient °

## 2015-06-19 NOTE — Progress Notes (Signed)
Patient Care Team: Leanna Battles, MD as PCP - General (Internal Medicine)  DIAGNOSIS: No matching staging information was found for the patient.  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 She had benign calcifications 6 months ago and is scheduled to undergo another mammogram today. She is very nervous about it. Denies any lumps or nodules in the breast. She has been exercising regularly as well as doing yoga.  REVIEW OF SYSTEMS:   Constitutional: Denies fevers, chills or abnormal weight loss Eyes: Denies blurriness of vision Ears, nose, mouth, throat, and face: Denies mucositis or sore throat Respiratory: Denies cough, dyspnea or wheezes Cardiovascular: Denies palpitation, chest discomfort or lower extremity swelling Gastrointestinal:  Denies nausea, heartburn or change in bowel habits Skin: Denies abnormal skin rashes Lymphatics: Denies new lymphadenopathy or easy bruising Neurological:Denies numbness, tingling or new weaknesses Behavioral/Psych: Mood is stable, no new changes  Breast:  denies any pain or lumps or nodules in either breasts All other systems were reviewed with the patient and are negative.  I have reviewed the past medical history, past surgical history, social history and family history with the patient and they are  unchanged from previous note.  ALLERGIES:  has No Known Allergies.  MEDICATIONS:  Current Outpatient Prescriptions  Medication Sig Dispense Refill  . aspirin 81 MG tablet Take 81 mg by mouth at bedtime.     . clobetasol cream (TEMOVATE) 9.82 % Apply 1 application topically 2 (two) times a week. 30 g 2  . Multiple Vitamins-Minerals (CENTRUM SILVER ADULT 50+ PO) Take by mouth.    . Multiple Vitamins-Minerals (CENTRUM SILVER PO) Take 1 tablet by mouth daily.      . pravastatin (PRAVACHOL) 20 MG tablet Take 20 mg by mouth daily.    . SUMAtriptan (IMITREX) 100 MG tablet Take 100 mg by mouth as needed for migraine.     . Vitamin D, Ergocalciferol, (DRISDOL) 50000 UNITS CAPS capsule   4  . tamoxifen (NOLVADEX) 20 MG tablet TAKE 1 TABLET (20 MG TOTAL) BY MOUTH DAILY. 90 tablet 3   No current facility-administered medications for this visit.    PHYSICAL EXAMINATION: ECOG PERFORMANCE STATUS: 0 - Asymptomatic  There were no vitals filed for this visit. There were no vitals filed for this visit.  GENERAL:alert, no distress and comfortable SKIN: skin color, texture, turgor are normal, no rashes or significant lesions EYES: normal, Conjunctiva are pink and non-injected, sclera clear OROPHARYNX:no exudate, no erythema and lips, buccal mucosa, and tongue normal  NECK: supple, thyroid normal size, non-tender, without nodularity LYMPH:  no palpable lymphadenopathy in the cervical, axillary or inguinal LUNGS: clear to auscultation and percussion with normal breathing effort HEART: regular rate & rhythm and no murmurs and no lower extremity edema ABDOMEN:abdomen soft, non-tender and normal bowel sounds Musculoskeletal:no cyanosis of digits and no clubbing  NEURO: alert & oriented x 3 with fluent speech, no  focal motor/sensory deficits BREAST: No palpable masses or nodules in either right or left breasts. No palpable axillary supraclavicular or infraclavicular adenopathy no breast tenderness or nipple  discharge. (exam performed in the presence of a chaperone)  LABORATORY DATA:  I have reviewed the data as listed   Chemistry      Component Value Date/Time   NA 144 05/26/2014 0832   NA 131* 02/05/2014 0530   K 3.4* 05/26/2014 0832   K 4.0 02/05/2014 0530   CL 94* 02/05/2014 0530   CO2 27 05/26/2014 0832   CO2 27 02/05/2014 0530   BUN 10.3 05/26/2014 0832   BUN 8 02/05/2014 0530   CREATININE 0.7 05/26/2014 0832   CREATININE 0.57 02/05/2014 0530      Component Value Date/Time   CALCIUM 9.1 05/26/2014 0832   CALCIUM 8.6 02/05/2014 0530   ALKPHOS 49 05/26/2014 0832   ALKPHOS 41 11/10/2012 1318   AST 19 05/26/2014 0832   AST 19 11/10/2012 1318   ALT 16 05/26/2014 0832   ALT 20 11/10/2012 1318   BILITOT 0.33 05/26/2014 0832   BILITOT 0.3 11/10/2012 1318       Lab Results  Component Value Date   WBC 4.9 05/26/2014   HGB 13.2 05/26/2014   HCT 40.8 05/26/2014   MCV 94.7 05/26/2014   PLT 228 05/26/2014   NEUTROABS 2.4 05/26/2014   ASSESSMENT & PLAN:  Cancer of right breast, stage 0 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  Tamoxifen toxicities: Patient is tolerating tamoxifen very well and plans to continue and finish of 5 years of therapy. Monitoring closely for tamoxifen-related toxicities.  Breast Cancer Surveillance: 1. Breast exam 06/19/15: Normal 2. Mammogram 12/07/14: Calcs Left Breast. 6 month follow up recommended. Breast density category D Discussed the differences between different breast density categories. Patient is having the repeat mammogram today.  I did call her with the result of this test.   No orders of the defined types were placed in this encounter.   The patient has a good understanding of the overall plan. she agrees with it. she will call with any problems that may develop before the next visit here.   Rulon Eisenmenger, MD

## 2015-06-22 ENCOUNTER — Encounter: Payer: Self-pay | Admitting: *Deleted

## 2015-06-22 NOTE — Progress Notes (Signed)
Received mammo report from [name], sent to scan. 

## 2015-12-10 NOTE — Assessment & Plan Note (Signed)
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  Tamoxifen toxicities: Patient is tolerating tamoxifen very well and plans to continue and finish of 5 years of therapy. Monitoring closely for tamoxifen-related toxicities.  Breast Cancer Surveillance: 1. Breast exam 12/10/15: Normal 2. Mammogram 12/07/14: Calcs Left Breast. 6 month follow up recommended. Breast density category D Discussed the differences between different breast density categories. Patient is having the repeat mammogram today.  I did call her with the result of this test.

## 2015-12-11 ENCOUNTER — Ambulatory Visit (HOSPITAL_BASED_OUTPATIENT_CLINIC_OR_DEPARTMENT_OTHER): Payer: PRIVATE HEALTH INSURANCE | Admitting: Hematology and Oncology

## 2015-12-11 ENCOUNTER — Encounter: Payer: Self-pay | Admitting: Hematology and Oncology

## 2015-12-11 ENCOUNTER — Telehealth: Payer: Self-pay | Admitting: Hematology and Oncology

## 2015-12-11 VITALS — BP 101/69 | HR 79 | Temp 98.4°F | Resp 18 | Ht 62.0 in | Wt 110.0 lb

## 2015-12-11 DIAGNOSIS — Z79811 Long term (current) use of aromatase inhibitors: Secondary | ICD-10-CM

## 2015-12-11 DIAGNOSIS — D0591 Unspecified type of carcinoma in situ of right breast: Secondary | ICD-10-CM

## 2015-12-11 MED ORDER — TAMOXIFEN CITRATE 20 MG PO TABS: ORAL_TABLET | ORAL | Status: DC

## 2015-12-11 NOTE — Progress Notes (Signed)
Unable to get in to exam room prior to MD.  No assessment performed.  

## 2015-12-11 NOTE — Telephone Encounter (Signed)
appt made and avs printed °

## 2015-12-11 NOTE — Progress Notes (Signed)
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 She appears to be tolerating extremely well. She finishes 5 years of therapy as of June 2017. She denies any lumps or nodules in the breast. Her mammogram is scheduled for next week.  REVIEW OF SYSTEMS:   Constitutional: Denies fevers, chills or abnormal weight loss Eyes: Denies blurriness of vision Ears, nose, mouth, throat, and face: Denies mucositis or sore throat Respiratory: Denies cough, dyspnea or wheezes Cardiovascular: Denies palpitation, chest discomfort Gastrointestinal:  Denies nausea, heartburn or change in bowel habits Skin: Denies abnormal skin rashes Lymphatics: Denies new lymphadenopathy or easy bruising Neurological:Denies numbness, tingling or new weaknesses Behavioral/Psych: Mood is stable, no new changes  Extremities: No lower extremity edema Breast:  denies any pain or lumps or nodules in either breasts All other systems were reviewed with the patient and are negative.  I have reviewed the past medical history, past surgical history, social history and family history with the patient and they are unchanged from previous note.  ALLERGIES:  has No Known Allergies.  MEDICATIONS:  Current Outpatient  Prescriptions  Medication Sig Dispense Refill  . aspirin 81 MG tablet Take 81 mg by mouth at bedtime.     . clobetasol cream (TEMOVATE) [id number] % Apply 1 application topically 2 (two) times a week. 30 g 2  . Multiple Vitamins-Minerals (CENTRUM SILVER ADULT 50+ PO) Take by mouth.    . Multiple Vitamins-Minerals (CENTRUM SILVER PO) Take 1 tablet by mouth daily.      . pravastatin (PRAVACHOL) 20 MG tablet Take 20 mg by mouth daily.    . SUMAtriptan (IMITREX) 100 MG tablet Take 100 mg by mouth as needed for migraine.     . tamoxifen (NOLVADEX) 20 MG tablet TAKE 1 TABLET (20 MG TOTAL) BY MOUTH DAILY. 90 tablet 3  . Vitamin D, Ergocalciferol, (DRISDOL) 50000 UNITS CAPS capsule   4   No current facility-administered medications for this visit.    PHYSICAL EXAMINATION: ECOG PERFORMANCE STATUS: 1 - Symptomatic but completely ambulatory  Filed Vitals:   12/11/15 0918  BP: 101/69  Pulse: 79  Temp: 98.4 F (36.9 C)  Resp: 18   Filed Weights   12/11/15 0918  Weight: 110 lb (49.896 kg)    GENERAL:alert, no distress and comfortable SKIN: skin color, texture, turgor are normal, no rashes or significant lesions EYES: normal, Conjunctiva are pink and non-injected, sclera clear OROPHARYNX:no exudate, no erythema and lips, buccal mucosa, and tongue normal  NECK: supple, thyroid normal size, non-tender, without nodularity LYMPH:  no palpable lymphadenopathy in the cervical, axillary or inguinal LUNGS: clear to auscultation and percussion with normal breathing effort HEART: regular rate & rhythm and no murmurs and no lower extremity edema ABDOMEN:abdomen soft, non-tender and normal bowel sounds MUSCULOSKELETAL:no cyanosis of digits and no clubbing  NEURO: alert &  oriented x 3 with fluent speech, no focal motor/sensory deficits EXTREMITIES: No lower extremity edema   LABORATORY DATA:  I have reviewed the data as listed   Chemistry      Component Value Date/Time   NA 144 05/26/2014 0832   NA  131* 02/05/2014 0530   K 3.4* 05/26/2014 0832   K 4.0 02/05/2014 0530   CL 94* 02/05/2014 0530   CO2 27 05/26/2014 0832   CO2 27 02/05/2014 0530   BUN 10.3 05/26/2014 0832   BUN 8 02/05/2014 0530   CREATININE 0.7 05/26/2014 0832   CREATININE 0.57 02/05/2014 0530      Component Value Date/Time   CALCIUM 9.1 05/26/2014 0832   CALCIUM 8.6 02/05/2014 0530   ALKPHOS 49 05/26/2014 0832   ALKPHOS 41 11/10/2012 1318   AST 19 05/26/2014 0832   AST 19 11/10/2012 1318   ALT 16 05/26/2014 0832   ALT 20 11/10/2012 1318   BILITOT 0.33 05/26/2014 0832   BILITOT 0.3 11/10/2012 1318       Lab Results  Component Value Date   WBC 4.9 05/26/2014   HGB 13.2 05/26/2014   HCT 40.8 05/26/2014   MCV 94.7 05/26/2014   PLT 228 05/26/2014   NEUTROABS 2.4 05/26/2014   ASSESSMENT & PLAN:  Cancer of right breast, stage 0 DCIS right breast status post lumpectomy 12/03/2010, radiation and currently on antiestrogen therapy with tamoxifen started 02/24/2011  To complete June 2017  Tamoxifen toxicities: Patient is tolerating tamoxifen very well and plans to continue and finish of 5 years of therapy. Monitoring closely for tamoxifen-related toxicities.  Breast Cancer Surveillance: 1. Breast exam : Normal 2. Mammogram 12/07/14: Calcs Left Breast. 6 month follow up recommended. Breast density category D Discussed the differences between different breast density categories.   return to clinic in 1 year with survivorship   No orders of the defined types were placed in this encounter.   The patient has a good understanding of the overall plan. she agrees with it. she will call with any problems that may develop before the next visit here.   Rulon Eisenmenger, MD 12/11/2015

## 2015-12-21 ENCOUNTER — Encounter: Payer: Self-pay | Admitting: Hematology and Oncology

## 2015-12-28 ENCOUNTER — Telehealth: Payer: Self-pay

## 2015-12-28 NOTE — Telephone Encounter (Signed)
Called pt and left VM regarding bone density results showing t-score -3.3.  Informed pt to notify her PCP about results and to be followed for osteoporosis per Dr. Geralyn Flash wish.  Informed pt to start Vitamin D and calcium supplements until seen by PCP and to call us with any further questions.

## 2016-01-08 ENCOUNTER — Other Ambulatory Visit: Payer: Self-pay | Admitting: Dermatology

## 2016-12-03 ENCOUNTER — Telehealth: Payer: Self-pay | Admitting: Adult Health

## 2016-12-03 NOTE — Telephone Encounter (Signed)
Patient called to reschedule appointment per availability.

## 2016-12-06 ENCOUNTER — Encounter: Payer: PRIVATE HEALTH INSURANCE | Admitting: Adult Health

## 2016-12-10 ENCOUNTER — Encounter: Payer: PRIVATE HEALTH INSURANCE | Admitting: Nurse Practitioner

## 2016-12-13 ENCOUNTER — Telehealth: Payer: Self-pay | Admitting: Adult Health

## 2016-12-13 ENCOUNTER — Ambulatory Visit (HOSPITAL_BASED_OUTPATIENT_CLINIC_OR_DEPARTMENT_OTHER): Payer: PRIVATE HEALTH INSURANCE | Admitting: Adult Health

## 2016-12-13 ENCOUNTER — Encounter: Payer: Self-pay | Admitting: Adult Health

## 2016-12-13 VITALS — BP 115/70 | HR 78 | Temp 98.2°F | Resp 18 | Ht 62.0 in | Wt 115.6 lb

## 2016-12-13 DIAGNOSIS — Z86000 Personal history of in-situ neoplasm of breast: Secondary | ICD-10-CM

## 2016-12-13 DIAGNOSIS — D0591 Unspecified type of carcinoma in situ of right breast: Secondary | ICD-10-CM

## 2016-12-13 NOTE — Telephone Encounter (Signed)
Gave patient avs report and appointments for April 2019. Patient will contact [name] re arranging mammo.

## 2016-12-13 NOTE — Progress Notes (Signed)
CLINIC:  Survivorship   REASON FOR VISIT:  Routine follow-up for history of breast cancer.   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  Overall, she reports feeling quite well.  She does have some aching in her right breast that has been constant since surgery.  She is concerned it may be due to recurrence.  She admits she is anxious about her breast cancer and the thought of it coming back.  She is not currently exercising and admits that she wants to get back into exercising.      REVIEW OF SYSTEMS:  Review of Systems  Constitutional: Negative for chills, fever, malaise/fatigue and weight loss.  HENT: Negative for hearing loss and tinnitus.   Eyes: Negative for blurred vision and double vision.  Respiratory: Negative for cough and shortness of breath.   Cardiovascular: Positive for palpitations (occasional, declined holter offered by PCP). Negative for chest pain and leg swelling.  Gastrointestinal: Negative for abdominal pain, constipation, diarrhea, heartburn, nausea and vomiting.  Musculoskeletal: Negative for back pain, joint pain, myalgias and neck pain.  Skin: Negative for rash.  Neurological: Negative for dizziness, focal weakness and headaches.  Endo/Heme/Allergies: Negative for environmental allergies. Does not bruise/bleed easily.  Psychiatric/Behavioral:  Negative for depression. The patient is nervous/anxious.   Breast: Denies any new nodularity, masses, tenderness, nipple changes, or nipple discharge.        PAST MEDICAL/SURGICAL HISTORY:  Past Medical History:  Diagnosis Date  . Breast cancer, stage 0   . Cancer ([place]) 1996   skin cancer on eye lid  . Cervical polyp Feb. 2011   benign  . Ectopic pregnancy   . Fibroid uterus 2007  . Hyperlipidemia   . Migraine    1 every 6 weeks. food triggered/hormonal   Past Surgical History:  Procedure Laterality Date  . ABDOMINAL HYSTERECTOMY Bilateral 02/04/2014   Procedure: TOTAL ABDOMINAL HYSTERECTOMY, BILATERAL SALPINGO OOPHORECTOMY ;  Surgeon: [name], MD;  Location: [facility];  Service: Gynecology;  Laterality: Bilateral;  . BREAST LUMPECTOMY     right breast lumpectomy, snbx  . CERVICAL POLYPECTOMY  feb. 2011  . DILATION AND CURETTAGE OF UTERUS     several d &c - polyps and bleeding  . ECTOPIC PREGNANCY SURGERY  August 1997   Laparoscopy for ectopic  . HYSTEROSCOPY W/D&C N/A 11/05/2013   Procedure: DILATATION AND CURETTAGE ;  Surgeon: [name], MD;  Location: [facility];  Service: Gynecology;  Laterality: N/A;   with Ultrasound guidance  . SKIN CANCER EXCISION  1996   removal of small spot on eyelid, noninvasive (basal cell) skin cancer  . TONSILLECTOMY    . WISDOM TOOTH EXTRACTION       ALLERGIES:  No Known Allergies   CURRENT MEDICATIONS:  Outpatient Encounter Prescriptions as of 12/13/2016  Medication Sig Note  . aspirin 81 MG tablet Take 81 mg by mouth at bedtime.    Marland Kitchen CRANBERRY PO Take by mouth daily.   . Multiple Vitamins-Minerals (CENTRUM SILVER PO) Take 1 tablet by mouth daily.     . pravastatin (PRAVACHOL) 20 MG tablet Take 20 mg by mouth daily.   . Vitamin D, Ergocalciferol, (DRISDOL) 50000 UNITS CAPS capsule  12/12/2014: Received from: External Pharmacy  . SUMAtriptan (IMITREX) 100 MG tablet Take 100 mg by mouth as needed for migraine.    .  [DISCONTINUED] clobetasol cream (TEMOVATE) 3.66 % Apply 1 application topically 2 (two) times a week.   . [DISCONTINUED] Multiple Vitamins-Minerals (CENTRUM SILVER ADULT 50+ PO) Take by mouth. 12/12/2014: Received from: [facility]  . [DISCONTINUED] tamoxifen (NOLVADEX) 20 MG tablet TAKE 1 TABLET (20 MG TOTAL) BY MOUTH DAILY.    No facility-administered encounter medications on file as of 12/13/2016.      ONCOLOGIC FAMILY HISTORY:  Family History  Problem Relation Age of Onset  . Heart disease Father   . Early death Cousin      SOCIAL HISTORY:  Connie NORMENT is married and lives with her husband in [place], [place].  She is close with her mom and sees her monthly.  Her mother lives in [place], [place].   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  She denies any current or history of tobacco, alcohol, or illicit drug use.     PHYSICAL EXAMINATION:  Vital Signs: Vitals:   12/13/16 0858  BP: 115/70  Pulse: 78  Resp: 18  Temp: 98.2 F (36.8 C)   Filed Weights   12/13/16 0858  Weight: 115 lb 9.6 oz (52.4 kg)   General: Well-nourished, well-appearing female in no acute distress.  Unaccompanied today.   HEENT: Head is normocephalic.  Pupils equal and reactive to light. Conjunctivae clear without exudate.  Sclerae anicteric. Oral mucosa is pink, moist.  Oropharynx is pink without lesions or erythema.  Lymph: No cervical, supraclavicular, or infraclavicular lymphadenopathy noted on palpation.  Cardiovascular: Regular rate and rhythm.Marland Kitchen Respiratory: Clear to auscultation bilaterally. Chest expansion symmetric; breathing non-labored.  Breast Exam:  -Left breast: No appreciable masses on palpation. No skin redness, thickening, or peau d'orange appearance; no nipple retraction or nipple discharge;  -Right breast: No appreciable masses on palpation. No skin redness, thickening, or peau d'orange appearance; no nipple retraction  or nipple discharge; mild distortion in symmetry at previous lumpectomy site well healed scar without erythema or nodularity. -Axilla: No axillary adenopathy bilaterally.  GI: Abdomen soft and round; non-tender, non-distended. Bowel sounds normoactive. No hepatosplenomegaly.   GU: Deferred.  Neuro: No focal deficits. Steady gait.  Psych: Mood and affect normal and appropriate for situation.  Extremities: No edema. Skin: Warm and dry.  LABORATORY DATA:  None for this visit   DIAGNOSTIC IMAGING:  Most recent mammogram:      ASSESSMENT AND PLAN:  Connie Miller is a pleasant 61 y.o. female with history of Stage 0 right breast DCIS, ER+/PR+, diagnosed in 2012, treated with lumpectomy, adjuvant radiation therapy, and anti-estrogen therapy with Tamoxifen.  [facility] for surveillance and routine follow-up.   1. History of breast cancer:  Connie Miller is currently clinically and radiographically without evidence of disease or recurrence of breast cancer. She will be due for mammogram in 12/2016; orders placed today.    I encouraged her to call me with any  questions or concerns before her next visit at the cancer center, and I would be happy to see her sooner, if needed.    2. Bone health:  Given Connie Miller's age, history of breast cancer, she is at slight risk for bone demineralization. I will defer to her PCP in ordering this and following it.  In the meantime, she was encouraged to increase her consumption of foods rich in calcium, as well as increase her weight-bearing activities.  She was given education on specific food and activities to promote bone health.  3. Cancer screening:  Due to Connie Miller's history and her age, she should receive screening for skin cancers, colon cancer, and gynecologic cancers. She was encouraged to follow-up with her PCP for appropriate cancer screenings.   4. Health maintenance and wellness promotion: Connie Miller was encouraged to  consume 5-7 servings of fruits and vegetables per day. She was also encouraged to engage in moderate to vigorous exercise for 30 minutes per day most days of the week. She was instructed to limit her alcohol consumption and continue to abstain from tobacco use.  We discussed in detail self breast awareness including palpating her breasts as well as visual inspection.  I gave her information on diet, [employer], and reducing her cancer risk.      Dispo:  -Return to cancer center in one year -Mammogram later this month when due -f/u with PCP for general care and cancer screening   A total of (30) minutes of face-to-face time was spent with this patient with greater than 50% of that time in counseling and care-coordination.   Gardenia Phlegm, NP Survivorship Program [facility] [phone]   Note: PRIMARY CARE PROVIDER Donnajean Lopes, [place] [phone]

## 2017-01-07 ENCOUNTER — Encounter: Payer: Self-pay | Admitting: Hematology and Oncology

## 2017-01-14 NOTE — Progress Notes (Signed)
Received [facility] 3d mammogram results. Sent to scan.

## 2017-12-19 ENCOUNTER — Inpatient Hospital Stay: Payer: PRIVATE HEALTH INSURANCE | Attending: Adult Health | Admitting: Adult Health

## 2017-12-19 ENCOUNTER — Encounter: Payer: Self-pay | Admitting: Adult Health

## 2017-12-19 ENCOUNTER — Telehealth: Payer: Self-pay | Admitting: Adult Health

## 2017-12-19 VITALS — BP 121/74 | HR 75 | Temp 98.5°F | Resp 18 | Ht 62.0 in | Wt 114.3 lb

## 2017-12-19 DIAGNOSIS — Z79899 Other long term (current) drug therapy: Secondary | ICD-10-CM | POA: Insufficient documentation

## 2017-12-19 DIAGNOSIS — Z7982 Long term (current) use of aspirin: Secondary | ICD-10-CM | POA: Diagnosis not present

## 2017-12-19 DIAGNOSIS — Z86000 Personal history of in-situ neoplasm of breast: Secondary | ICD-10-CM | POA: Insufficient documentation

## 2017-12-19 DIAGNOSIS — D0591 Unspecified type of carcinoma in situ of right breast: Secondary | ICD-10-CM

## 2017-12-19 DIAGNOSIS — Z923 Personal history of irradiation: Secondary | ICD-10-CM

## 2017-12-19 NOTE — Telephone Encounter (Signed)
Gave avs and calendar patient wanted May

## 2017-12-19 NOTE — Patient Instructions (Signed)
Bone Health Bones protect organs, store calcium, and anchor muscles. Good health habits, such as eating nutritious foods and exercising regularly, are important for maintaining healthy bones. They can also help to prevent a condition that causes bones to lose density and become weak and brittle (osteoporosis). Why is bone mass important? Bone mass refers to the amount of bone tissue that you have. The higher your bone mass, the stronger your bones. An important step toward having healthy bones throughout life is to have strong and dense bones during childhood. A young adult who has a high bone mass is more likely to have a high bone mass later in life. Bone mass at its greatest it is called peak bone mass. A large decline in bone mass occurs in older adults. In women, it occurs about the time of menopause. During this time, it is important to practice good health habits, because if more bone is lost than what is replaced, the bones will become less healthy and more likely to break (fracture). If you find that you have a low bone mass, you may be able to prevent osteoporosis or further bone loss by changing your diet and lifestyle. How can I find out if my bone mass is low? Bone mass can be measured with an X-ray test that is called a bone mineral density (BMD) test. This test is recommended for all women who are age 65 or older. It may also be recommended for men who are age 70 or older, or for people who are more likely to develop osteoporosis due to:  Having bones that break easily.  Having a long-term disease that weakens bones, such as kidney disease or rheumatoid arthritis.  Having menopause earlier than normal.  Taking medicine that weakens bones, such as steroids, thyroid hormones, or hormone treatment for breast cancer or prostate cancer.  Smoking.  Drinking three or more alcoholic drinks each day.  What are the nutritional recommendations for healthy bones? To have healthy bones, you  need to get enough of the right minerals and vitamins. Most nutrition experts recommend getting these nutrients from the foods that you eat. Nutritional recommendations vary from person to person. Ask your health care provider what is healthy for you. Here are some general guidelines. Calcium Recommendations Calcium is the most important (essential) mineral for bone health. Most people can get enough calcium from their diet, but supplements may be recommended for people who are at risk for osteoporosis. Good sources of calcium include:  Dairy products, such as low-fat or nonfat milk, cheese, and yogurt.  Dark green leafy vegetables, such as bok choy and broccoli.  Calcium-fortified foods, such as orange juice, cereal, bread, soy beverages, and tofu products.  Nuts, such as almonds.  Follow these recommended amounts for daily calcium intake:  Children, age 1?3: 700 mg.  Children, age 4?8: 1,000 mg.  Children, age 9?13: 1,300 mg.  Teens, age 14?18: 1,300 mg.  Adults, age 19?50: 1,000 mg.  Adults, age 51?70: ? Men: 1,000 mg. ? Women: 1,200 mg.  Adults, age 71 or older: 1,200 mg.  Pregnant and breastfeeding females: ? Teens: 1,300 mg. ? Adults: 1,000 mg.  Vitamin D Recommendations Vitamin D is the most essential vitamin for bone health. It helps the body to absorb calcium. Sunlight stimulates the skin to make vitamin D, so be sure to get enough sunlight. If you live in a cold climate or you do not get outside often, your health care provider may recommend that you take vitamin   D supplements. Good sources of vitamin D in your diet include:  Egg yolks.  Saltwater fish.  Milk and cereal fortified with vitamin D.  Follow these recommended amounts for daily vitamin D intake:  Children and teens, age 1?18: 600 international units.  Adults, age 50 or younger: 400-800 international units.  Adults, age 51 or older: 800-1,000 international units.  Other Nutrients Other nutrients  for bone health include:  Phosphorus. This mineral is found in meat, poultry, dairy foods, nuts, and legumes. The recommended daily intake for adult men and adult women is 700 mg.  Magnesium. This mineral is found in seeds, nuts, dark green vegetables, and legumes. The recommended daily intake for adult men is 400?420 mg. For adult women, it is 310?320 mg.  Vitamin K. This vitamin is found in green leafy vegetables. The recommended daily intake is 120 mg for adult men and 90 mg for adult women.  What type of physical activity is best for building and maintaining healthy bones? Weight-bearing and strength-building activities are important for building and maintaining peak bone mass. Weight-bearing activities cause muscles and bones to work against gravity. Strength-building activities increases muscle strength that supports bones. Weight-bearing and muscle-building activities include:  Walking and hiking.  Jogging and running.  Dancing.  Gym exercises.  Lifting weights.  Tennis and racquetball.  Climbing stairs.  Aerobics.  Adults should get at least 30 minutes of moderate physical activity on most days. Children should get at least 60 minutes of moderate physical activity on most days. Ask your health care provide what type of exercise is best for you. Where can I find more information? For more information, check out the following websites:  National Osteoporosis Foundation: http://nof.org/learn/basics  National Institutes of Health: http://www.niams.nih.gov/Health_Info/Bone/Bone_Health/bone_health_for_life.asp  This information is not intended to replace advice given to you by your health care provider. Make sure you discuss any questions you have with your health care provider. Document Released: 11/16/2003 Document Revised: 03/15/2016 Document Reviewed: 08/31/2014 Elsevier Interactive Patient Education  2018 [employer].  

## 2017-12-19 NOTE — Progress Notes (Signed)
CLINIC:  Survivorship   REASON FOR VISIT:  Routine follow-up for history of breast cancer.   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 today for routine follow-up for her history of breast cancer.  Overall, she reports feeling quite well. She has stopped caffeine one year ago.  She is feeling better in terms of achiness.  She will have bone density over the summer by Dr. Sharlett Iles.  She exercises daily with walking and weights.  She is taking calcium in her diet and takes vitamin d and weight bearing exercises.     REVIEW OF SYSTEMS:  Review of Systems  Constitutional: Negative for appetite change, chills, fatigue, fever and unexpected weight change.  HENT:   Negative for hearing loss, lump/mass and trouble swallowing.   Eyes: Negative for eye problems and icterus.  Respiratory: Negative for chest tightness, cough and shortness of breath.   Cardiovascular: Negative for chest pain, leg swelling and palpitations.  Gastrointestinal: Negative for abdominal distention, abdominal pain, constipation, diarrhea, nausea and vomiting.  Endocrine: Negative for hot flashes.  Skin: Negative for itching and rash.  Neurological: Negative for dizziness, extremity weakness, headaches and numbness.  Hematological: Negative for adenopathy. Does not bruise/bleed easily.  Psychiatric/Behavioral: Negative for depression. The patient is not  nervous/anxious.   Breast: Denies any new nodularity, masses, tenderness, nipple changes, or nipple discharge.       PAST MEDICAL/SURGICAL HISTORY:  Past Medical History:  Diagnosis Date  . Breast cancer, stage 0   . Cancer ([place]) 1996   skin cancer on eye lid  . Cervical polyp Feb. 2011   benign  . Ectopic pregnancy   . Fibroid uterus 2007  . Hyperlipidemia   . Migraine    1 every 6 weeks. food triggered/hormonal   Past Surgical History:  Procedure Laterality Date  . ABDOMINAL HYSTERECTOMY Bilateral 02/04/2014   Procedure: TOTAL ABDOMINAL HYSTERECTOMY, BILATERAL SALPINGO OOPHORECTOMY ;  Surgeon: [name], MD;  Location: [facility];  Service: Gynecology;  Laterality: Bilateral;  . BREAST LUMPECTOMY     right breast lumpectomy, snbx  . CERVICAL POLYPECTOMY  feb. 2011  . DILATION AND CURETTAGE OF UTERUS     several d &c - polyps and bleeding  . ECTOPIC PREGNANCY SURGERY  August 1997   Laparoscopy for ectopic  . HYSTEROSCOPY W/D&C N/A 11/05/2013   Procedure: DILATATION AND CURETTAGE ;  Surgeon: [name], MD;  Location: [facility];  Service: Gynecology;  Laterality: N/A;   with Ultrasound guidance  . SKIN CANCER EXCISION  1996   removal of small spot on eyelid, noninvasive (basal cell) skin cancer  . TONSILLECTOMY    . WISDOM TOOTH EXTRACTION       ALLERGIES:  No Known Allergies   CURRENT MEDICATIONS:  Outpatient Encounter Medications as of 12/19/2017  Medication Sig Note  . aspirin 81 MG tablet Take 81  mg by mouth at bedtime.    Marland Kitchen CRANBERRY PO Take by mouth daily.   . Multiple Vitamins-Minerals (CENTRUM SILVER PO) Take 1 tablet by mouth daily.     . pravastatin (PRAVACHOL) 20 MG tablet Take 20 mg by mouth daily.   . SUMAtriptan (IMITREX) 100 MG tablet Take 100 mg by mouth as needed for migraine.    . Vitamin D, Ergocalciferol, (DRISDOL) 50000 UNITS CAPS capsule  12/12/2014: Received from: External Pharmacy   No facility-administered encounter medications on  file as of 12/19/2017.      ONCOLOGIC FAMILY HISTORY:  Family History  Problem Relation Age of Onset  . Heart disease Father   . Early death Cousin      SOCIAL HISTORY:  Social History   Socioeconomic History  . Marital status: Married    Spouse name: Not on file  . Number of children: Not on file  . Years of education: Not on file  . Highest education level: Not on file  Occupational History  . Not on file  Social Needs  . Financial resource strain: Not on file  . Food insecurity:    Worry: Not on file    Inability: Not on file  . Transportation needs:    Medical: Not on file    Non-medical: Not on file  Tobacco Use  . Smoking status: Never Smoker  . Smokeless tobacco: Never Used  Substance and Sexual Activity  . Alcohol use: No    Alcohol/week: 0.0 oz  . Drug use: No  . Sexual activity: Yes    Partners: Male    Birth control/protection: Post-menopausal  Lifestyle  . Physical activity:    Days per week: Not on file    Minutes per session: Not on file  . Stress: Not on file  Relationships  . Social connections:    Talks on phone: Not on file    Gets together: Not on file    Attends religious service: Not on file    Active member of club or organization: Not on file    Attends meetings of clubs or organizations: Not on file    Relationship status: Not on file  . Intimate partner violence:    Fear of current or ex partner: Not on file    Emotionally abused: Not on file    Physically abused: Not on file    Forced sexual activity: Not on file  Other Topics Concern  . Not on file  Social History Narrative  . Not on file     PHYSICAL EXAMINATION:  Vital Signs: Vitals:   12/19/17 0815  BP: 121/74  Pulse: 75  Resp: 18  Temp: 98.5 F (36.9 C)  SpO2: 100%   Filed Weights   12/19/17 0815  Weight: 114 lb 4.8 oz (51.8 kg)   General: Well-nourished, well-appearing female in no acute distress.  Unaccompanied today.   HEENT: Head is normocephalic.   Pupils equal and reactive to light. Conjunctivae clear without exudate.  Sclerae anicteric. Oral mucosa is pink, moist.  Oropharynx is pink without lesions or erythema.  Lymph: No cervical, supraclavicular, or infraclavicular lymphadenopathy noted on palpation.  Cardiovascular: Regular rate and rhythm.Marland Kitchen Respiratory: Clear to auscultation bilaterally. Chest expansion symmetric; breathing non-labored.  Breast Exam:  -Left breast: No appreciable masses on palpation. No skin redness, thickening, or peau d'orange appearance; no nipple retraction or nipple discharge;  -Right breast: No appreciable masses on palpation. No skin redness, thickening, or peau d'orange appearance; no nipple retraction or nipple  discharge; mild distortion in symmetry at previous lumpectomy site well healed scar without erythema or nodularity. -Axilla: No axillary adenopathy bilaterally.  GI: Abdomen soft and round; non-tender, non-distended. Bowel sounds normoactive. No hepatosplenomegaly.   GU: Deferred.  Neuro: No focal deficits. Steady gait.  Psych: Mood and affect normal and appropriate for situation.  MSK: No focal spinal tenderness to palpation, full range of motion in bilateral upper extremities Extremities: No edema. Skin: Warm and dry.  LABORATORY DATA:  None for this visit   DIAGNOSTIC IMAGING:  Most recent mammogram: awaiting from [place]:  Connie Miller is a pleasant 62 y.o. 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  [facility] and routine follow-up.   1. History of breast cancer:  Connie Miller is currently clinically and radiographically without evidence of disease or recurrence of breast cancer. She will be due for mammogram in 01/2018.  She will return to the cancer center  in one year for LTS follow up.  I encouraged her to call me with any questions or concerns before her next visit at the cancer center, and I would be happy to see her sooner, if needed.    2. Bone health:  Given Connie Miller age, history of breast cancer, and osteoporosis, she is at risk for further bone demineralization. Her last DEXA scan was on 12/21/2015 and demonstrated a T score of -3.3 in the AP spine consistent with osteoporosis.  In the meantime, she was encouraged to increase her consumption of foods rich in calcium, as well as increase her weight-bearing activities.  She was given education on specific food and activities to promote bone health.  She will continue to f/u with her PCP regarding her bone health and management of her bone density testing.  3. Cancer screening:  Due to Connie Miller history and her age, she should receive screening for skin cancers, colon cancer, and gynecologic cancers. She was encouraged to follow-up with her PCP for appropriate cancer screenings.   4. Health maintenance and wellness promotion: Connie Miller was encouraged to consume 5-7 servings of fruits and vegetables per day. She was also encouraged to engage in moderate to vigorous exercise for 30 minutes per day most days of the week. She was instructed to limit her alcohol consumption and continue to abstain from tobacco use.     Dispo:  -Return to cancer center in one year for LTS follow up -Mammogram 01/2018   A total of (30) minutes of face-to-face time was spent with this patient with greater than 50% of that time in counseling and care-coordination.   Gardenia Phlegm, [place] [phone]   Note: PRIMARY CARE PROVIDER Leanna Battles, [place] [phone]

## 2018-01-13 ENCOUNTER — Encounter: Payer: Self-pay | Admitting: Hematology and Oncology

## 2018-12-28 ENCOUNTER — Telehealth: Payer: Self-pay | Admitting: Adult Health

## 2018-12-28 NOTE — Telephone Encounter (Signed)
R/s appt per 4/17 sch message - left message for patient with appt date and time

## 2019-01-14 ENCOUNTER — Telehealth: Payer: Self-pay | Admitting: Adult Health

## 2019-01-14 NOTE — Telephone Encounter (Signed)
Spoke with patient and confirmed Webex mtg on 01/18/19. Emailed step-by-step instructions how to download and access [employer].

## 2019-01-18 ENCOUNTER — Encounter: Payer: Self-pay | Admitting: Adult Health

## 2019-01-18 ENCOUNTER — Inpatient Hospital Stay: Payer: PRIVATE HEALTH INSURANCE | Attending: Adult Health | Admitting: Adult Health

## 2019-01-18 DIAGNOSIS — Z923 Personal history of irradiation: Secondary | ICD-10-CM

## 2019-01-18 DIAGNOSIS — Z90710 Acquired absence of both cervix and uterus: Secondary | ICD-10-CM

## 2019-01-18 DIAGNOSIS — Z79810 Long term (current) use of selective estrogen receptor modulators (SERMs): Secondary | ICD-10-CM

## 2019-01-18 DIAGNOSIS — Z7982 Long term (current) use of aspirin: Secondary | ICD-10-CM

## 2019-01-18 DIAGNOSIS — Z79899 Other long term (current) drug therapy: Secondary | ICD-10-CM

## 2019-01-18 DIAGNOSIS — D0591 Unspecified type of carcinoma in situ of right breast: Secondary | ICD-10-CM

## 2019-01-18 DIAGNOSIS — Z170 Estrogen receptor positive status [ER+]: Secondary | ICD-10-CM

## 2019-01-18 NOTE — Progress Notes (Signed)
SURVIVORSHIP VIRTUAL VISIT:  I connected with Connie Miller on 01/18/19 at  1:30 PM EDT by [facility] which had to change to telephone and verified that I am speaking with the correct person using two identifiers.   I discussed the limitations, risks, security and privacy concerns of performing an evaluation and management service by telephone and the availability of in person appointments. I also discussed with the patient that there may be a patient responsible charge related to this service. The patient expressed understanding and agreed to proceed.     REASON FOR VISIT:  Routine follow-up for history of breast cancer.   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  Overall, she reports feeling quite well. She notes that she went to have her mammogram last week and was rescheduled because she was due for a screening mammogram instead of a diagnostic mammogram.  She has this rescheduled on 01/25/2019.  On her mammogram from 01/2018 she was noted to have no evidence of malignancy and breast density category D.  She is unsure as to what breast density means.  Connie Miller is seeing her PCP regularly. She is up to date with her cancer screenings and eats a well balanced diet and exercises daily.    REVIEW OF SYSTEMS:  Review of Systems  Constitutional: Negative for appetite change, chills, fatigue, fever  and unexpected weight change.  HENT:   Negative for hearing loss, lump/mass, sore throat and trouble swallowing.   Eyes: Negative for eye problems and icterus.  Respiratory: Negative for chest tightness, cough and shortness of breath.   Cardiovascular: Negative for chest pain, leg swelling and palpitations.  Gastrointestinal: Negative for abdominal distention, abdominal pain, constipation, diarrhea, nausea and vomiting.  Endocrine: Negative for hot flashes.  Musculoskeletal: Negative for arthralgias.  Skin: Negative for itching and rash.  Neurological: Negative for dizziness, extremity weakness, headaches and numbness.  Hematological: Negative for adenopathy. Does not bruise/bleed easily.  Psychiatric/Behavioral: Negative for depression. The patient is not nervous/anxious.   Breast: Denies any new nodularity, masses, tenderness, nipple changes, or nipple discharge.    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 2011   benign   Ectopic pregnancy    Fibroid uterus 2007   Hyperlipidemia    Migraine    1 every 6 weeks. food triggered/hormonal   Past Surgical History:  Procedure Laterality Date   ABDOMINAL HYSTERECTOMY Bilateral 02/04/2014   Procedure: TOTAL ABDOMINAL HYSTERECTOMY, BILATERAL SALPINGO OOPHORECTOMY ;  Surgeon: [name], MD;  Location: [facility];  Service: Gynecology;  Laterality: Bilateral;   BREAST LUMPECTOMY     right breast lumpectomy, snbx   CERVICAL POLYPECTOMY  feb. 2011   DILATION AND CURETTAGE OF UTERUS  several d &c - polyps and bleeding   ECTOPIC PREGNANCY SURGERY  August 1997   Laparoscopy for ectopic   HYSTEROSCOPY W/D&C N/A 11/05/2013   Procedure: DILATATION AND CURETTAGE ;  Surgeon: [name], MD;  Location: [facility];  Service: Gynecology;  Laterality: N/A;   with Ultrasound guidance   SKIN CANCER EXCISION   1996   removal of small spot on eyelid, noninvasive (basal cell) skin cancer   TONSILLECTOMY     WISDOM TOOTH EXTRACTION       ALLERGIES:  No Known Allergies   CURRENT MEDICATIONS:  Outpatient Encounter Medications as of 01/18/2019  Medication Sig Note   aspirin 81 MG tablet Take 81 mg by mouth at bedtime.     CRANBERRY PO Take by mouth daily.    Multiple Vitamins-Minerals (CENTRUM SILVER PO) Take 1 tablet by mouth daily.      pravastatin (PRAVACHOL) 20 MG tablet Take 20 mg by mouth daily.    SUMAtriptan (IMITREX) 100 MG tablet Take 100 mg by mouth as needed for migraine.     Vitamin D, Ergocalciferol, (DRISDOL) 50000 UNITS CAPS capsule  12/12/2014: Received from: External Pharmacy   No facility-administered encounter medications on file as of 01/18/2019.      ONCOLOGIC FAMILY HISTORY:  Family History  Problem Relation Age of Onset   Heart disease Father    Early death Cousin     GENETIC COUNSELING/TESTING: Not at this time  SOCIAL HISTORY:  Social History   Socioeconomic History   Marital status: Married    Spouse name: Not on file   Number of children: Not on file   Years of education: Not on file   Highest education level: Not on file  Occupational History   Not on file  Social Needs   Financial resource strain: Not on file   Food insecurity:    Worry: Not on file    Inability: Not on file   Transportation needs:    Medical: Not on file    Non-medical: Not on file  Tobacco Use   Smoking status: Never Smoker   Smokeless tobacco: Never Used  Substance and Sexual Activity   Alcohol use: No    Alcohol/week: 0.0 standard drinks   Drug use: No   Sexual activity: Yes    Partners: Male    Birth control/protection: Post-menopausal  Lifestyle   Physical activity:    Days per week: Not on file    Minutes per session: Not on file   Stress: Not on file  Relationships   Social connections:    Talks on phone: Not on file    Gets  together: Not on file    Attends religious service: Not on file    Active member of club or organization: Not on file    Attends meetings of clubs or organizations: Not on file    Relationship status: Not on file   Intimate partner violence:    Fear of current or ex partner: Not on file    Emotionally abused: Not on file    Physically abused: Not on file    Forced sexual activity: Not on file  Other Topics Concern   Not on file  Social History Narrative   Not on file      OBJECTIVE:  No apparent distress.  Speech is normal and non pressured.  Breathing non labored, Behavior and mood is normal  LABORATORY DATA:  None for this visit   DIAGNOSTIC IMAGING:  Most recent mammogram: delayed  until 01/25/2019    ASSESSMENT AND PLAN:  Connie Miller is a pleasant 63 y.o. 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  [facility] for surveillance and routine follow-up.   1. History of breast cancer:  Connie Miller is currently clinically and radiographically without evidence of disease or recurrence of breast cancer. She will be due for mammogram in one week and those orders are placed.  She has breast density category D.  I reviewed with her what that means (see #2).  She will return in 6 months at her request to have an in person appointment with a breast exam.  I encouraged her to call me with any questions or concerns before her next visit at the cancer center, and I would be happy to see her sooner, if needed.    2. Breast Density D: I reviewed that with breast density D, it lowers the sensitivity of mammograms.  I recommended supplemental MRIs and reviewed the FAST program at [facility].  We discussed the risks of doing MRIs including false positives and the potential for having biopsies  that are not really cancer.  She understands this.  Connie Miller wants to review this further and discuss at our appointment in 6 months.    3. Bone health:  Given Connie Miller's age, history of breast cancer, she is at risk for bone demineralization. Her last bone density was in 2019 and a T score of -2.6 in her right femur.  She is taking vitamin d, calcium, and is very active with weight bearing exercises.  She will continue this and I will defer bone density medical management to her PCP.  She was given education on specific food and activities to promote bone health.  4. Cancer screening:  Due to Connie Miller's history and her age, she should receive screening for skin cancers, colon cancer, and gynecologic cancers. She was encouraged to follow-up with her PCP for appropriate cancer screenings.   5. Health maintenance and wellness promotion: Connie Miller was encouraged to consume 5-7 servings of fruits and vegetables per day. She was also encouraged to engage in moderate to vigorous exercise for 30 minutes per day most days of the week. She was instructed to limit her alcohol consumption and continue to abstain from tobacco use.    Follow up instructions:    -Return to cancer center in 6 months  -Mammogram due in 01/2019 -Breast MRI in 07/2019 discussued    The patient was provided an opportunity to ask questions and all were answered. The patient agreed with the plan and demonstrated an understanding of the instructions.   The patient was advised to call back or seek an in-person evaluation if the symptoms worsen or if the condition fails to improve as anticipated.   I provided 17 minutes of face-to-face video visit time during this encounter, and > 50% was spent counseling as documented under my assessment & plan.   Gardenia Phlegm, [place] [phone]   Note: PRIMARY CARE PROVIDER Leanna Battles, [place] [phone]

## 2019-01-19 ENCOUNTER — Telehealth: Payer: Self-pay | Admitting: Adult Health

## 2019-01-19 NOTE — Telephone Encounter (Signed)
Tried to reach regarding schedule °

## 2019-01-22 ENCOUNTER — Encounter: Payer: PRIVATE HEALTH INSURANCE | Admitting: Adult Health

## 2019-01-25 ENCOUNTER — Encounter: Payer: Self-pay | Admitting: Hematology and Oncology

## 2019-08-02 ENCOUNTER — Telehealth: Payer: Self-pay | Admitting: Adult Health

## 2019-08-02 ENCOUNTER — Inpatient Hospital Stay: Payer: PRIVATE HEALTH INSURANCE | Admitting: Adult Health

## 2019-08-02 NOTE — Telephone Encounter (Signed)
Rescheduled appt per 11/23 sch message - pt aware of new appt date and time

## 2019-09-10 ENCOUNTER — Encounter: Payer: Self-pay | Admitting: Cardiology

## 2019-11-04 ENCOUNTER — Telehealth: Payer: Self-pay | Admitting: Adult Health

## 2019-11-04 NOTE — Telephone Encounter (Signed)
Rescheduled 3/22 due to provider PAL day. Pt is aware of her new appt date and time.

## 2019-11-29 ENCOUNTER — Telehealth: Payer: Self-pay | Admitting: Adult Health

## 2019-11-29 ENCOUNTER — Encounter: Payer: PRIVATE HEALTH INSURANCE | Admitting: Adult Health

## 2019-11-29 NOTE — Telephone Encounter (Signed)
R/s per provider. Called and spoke with pt, confirmed 5/4 appt

## 2019-12-20 NOTE — Progress Notes (Signed)
Cardiology Office Note:    Date:  12/21/2019   ID:  Connie Miller, DOB 06/09/1956, MRN [id number]  PCP:  Connie Battles, MD  Cardiologist:  Donato Heinz, MD  Electrophysiologist:  None   Referring MD: Connie Battles, MD   Chief Complaint  Patient presents with  . New Patient (Initial Visit)  . Palpitations    History of Present Illness:    Connie Miller is a 64 y.o. female with a hx of breast cancer, skin cancer, hyperlipidemia who is referred by Dr. Philip Aspen for evaluation of palpitations.  She reports that she received her second COVID-19 vaccine on March 10.  At night she had what she thought was a panic attack.  States that her heart was racing and she felt very anxious.  Episode lasted for hours.  She has had a couple more episodes of this since that time.  States that she has been walking every morning for 45 minutes.  She denies any dyspnea or chest pain.  Smoked briefly when younger.  Father had MI at age 70, died of MI at age 4.    Past Medical History:  Diagnosis Date  . Breast cancer, stage 0   . Cancer ([place]) 1996   skin cancer on eye lid  . Cervical polyp Feb. 2011   benign  . Ectopic pregnancy   . Fibroid uterus 2007  . Hyperlipidemia   . Migraine    1 every 6 weeks. food triggered/hormonal    Past Surgical History:  Procedure Laterality Date  . ABDOMINAL HYSTERECTOMY Bilateral 02/04/2014   Procedure: TOTAL ABDOMINAL HYSTERECTOMY, BILATERAL SALPINGO OOPHORECTOMY ;  Surgeon: [name], MD;  Location: [facility];  Service: Gynecology;  Laterality: Bilateral;  . BREAST LUMPECTOMY     right breast lumpectomy, snbx  . CERVICAL POLYPECTOMY  feb. 2011  . DILATION AND CURETTAGE OF UTERUS     several d &c - polyps and bleeding  . ECTOPIC PREGNANCY SURGERY  August 1997   Laparoscopy for ectopic  . HYSTEROSCOPY WITH D & C N/A 11/05/2013   Procedure: DILATATION AND CURETTAGE ;  Surgeon: [name], MD;  Location: [facility];  Service:  Gynecology;  Laterality: N/A;   with Ultrasound guidance  . SKIN CANCER EXCISION  1996   removal of small spot on eyelid, noninvasive (basal cell) skin cancer  . TONSILLECTOMY    . WISDOM TOOTH EXTRACTION      Current Medications: Current Meds  Medication Sig  . aspirin 81 MG tablet Take 81 mg by mouth at bedtime.   Marland Kitchen CRANBERRY PO Take by mouth daily.  Marland Kitchen escitalopram (LEXAPRO) 5 MG tablet Take 2.5 mg by mouth daily.  . Multiple Vitamins-Minerals (CENTRUM SILVER PO) Take 1 tablet by mouth daily.    . pravastatin (PRAVACHOL) 20 MG tablet Take 20 mg by mouth daily.  . SUMAtriptan (IMITREX) 100 MG tablet Take 100 mg by mouth as needed for migraine.   . Vitamin D, Ergocalciferol, (DRISDOL) 50000 UNITS CAPS capsule Take 50,000 Units by mouth every 7 (seven) days.      Allergies:   Patient has no known allergies.   Social History   Socioeconomic History  . Marital status: Married    Spouse name: Not on file  . Number of children: Not on file  . Years of education: Not on file  . Highest education level: Not on file  Occupational History  . Not on file  Tobacco Use  . Smoking status: Never Smoker  .  Smokeless tobacco: Never Used  Substance and Sexual Activity  . Alcohol use: No    Alcohol/week: 0.0 standard drinks  . Drug use: No  . Sexual activity: Yes    Partners: Male    Birth control/protection: Post-menopausal  Other Topics Concern  . Not on file  Social History Narrative  . Not on file   Social Determinants of Health   Financial Resource Strain:   . Difficulty of Paying Living Expenses:   Food Insecurity:   . Worried About Charity fundraiser in the Last Year:   . Arboriculturist in the Last Year:   Transportation Needs:   . Film/video editor (Medical):   Marland Kitchen Lack of Transportation (Non-Medical):   Physical Activity:   . Days of Exercise per Week:   . Minutes of Exercise per Session:   Stress:   . Feeling of Stress :   Social Connections:   . Frequency  of Communication with Friends and Family:   . Frequency of Social Gatherings with Friends and Family:   . Attends Religious Services:   . Active Member of Clubs or Organizations:   . Attends Archivist Meetings:   Marland Kitchen Marital Status:      Family History: The patient's family history includes Early death in her cousin; Heart disease in her father.  ROS:   Please see the history of present illness.     All other systems reviewed and are negative.  EKGs/Labs/Other Studies Reviewed:    The following studies were reviewed today:   EKG:  EKG is ordered today.  The ekg ordered today demonstrates NSR, rate 97,  Recent Labs: No results found for requested labs within last 8760 hours.  Recent Lipid Panel No results found for: CHOL, TRIG, HDL, CHOLHDL, VLDL, LDLCALC, LDLDIRECT  Physical Exam:    VS:  BP 140/80 (BP Location: Left Arm, Patient Position: Sitting, Cuff Size: Normal)   Pulse 97   Temp (!) 97 F (36.1 C)   Ht 5\' 2"  (1.575 m)   Wt 114 lb (51.7 kg)   BMI 20.85 kg/m     Wt Readings from Last 3 Encounters:  12/21/19 114 lb (51.7 kg)  12/19/17 114 lb 4.8 oz (51.8 kg)  12/13/16 115 lb 9.6 oz (52.4 kg)     GEN:  Well nourished, well developed in no acute distress HEENT: Normal NECK: No JVD; No carotid bruits LYMPHATICS: No lymphadenopathy CARDIAC: RRR, no murmurs, rubs, gallops RESPIRATORY:  Clear to auscultation without rales, wheezing or rhonchi  ABDOMEN: Soft, non-tender, non-distended MUSCULOSKELETAL:  No edema; No deformity  SKIN: Warm and dry NEUROLOGIC:  Alert and oriented x 3 PSYCHIATRIC:  Normal affect   ASSESSMENT:    1. Palpitations   2. Family history of coronary artery disease   3. Hyperlipidemia, unspecified hyperlipidemia type    PLAN:     Palpitations: description concerning for panic attack, but will evaluate with Zio paatch x 2 weeks to rule out arrhythmia  Hyperlipidemia: On pravastatin 20 mg daily.  LDL 116 06/2019.  10 year  ASCVD risk score 5%.  Will check calcium score to evaluate how aggressive to be in lowering cholesterol.  RTC in 3 months   Medication Adjustments/Labs and Tests Ordered: Current medicines are reviewed at length with the patient today.  Concerns regarding medicines are outlined above.  Orders Placed This Encounter  Procedures  . CT CARDIAC SCORING  . LONG TERM MONITOR (3-14 DAYS)  . EKG 12-Lead  No orders of the defined types were placed in this encounter.   Patient Instructions  Medication Instructions:  Your physician recommends that you continue on your current medications as directed. Please refer to the Current Medication list given to you today.  *If you need a refill on your cardiac medications before your next appointment, please call your pharmacy*   Lab Work: NONE  Testing/Procedures: CT coronary calcium score. This test is done at [address]. This is $150 out of pocket.   Coronary CalciumScan A coronary calcium scan is an imaging test used to look for deposits of calcium and other fatty materials (plaques) in the inner lining of the blood vessels of the heart (coronary arteries). These deposits of calcium and plaques can partly clog and narrow the coronary arteries without producing any symptoms or warning signs. This puts a person at risk for a heart attack. This test can detect these deposits before symptoms develop. Tell a health care provider about:  Any allergies you have.  All medicines you are taking, including vitamins, herbs, eye drops, creams, and over-the-counter medicines.  Any problems you or family members have had with anesthetic medicines.  Any blood disorders you have.  Any surgeries you have had.  Any medical conditions you have.  Whether you are pregnant or may be pregnant. What are the risks? Generally, this is a safe procedure. However, problems may occur, including:  Harm to a pregnant woman and her unborn baby.  This test involves the use of radiation. Radiation exposure can be dangerous to a pregnant woman and her unborn baby. If you are pregnant, you generally should not have this procedure done.  Slight increase in the risk of cancer. This is because of the radiation involved in the test. What happens before the procedure? No preparation is needed for this procedure. What happens during the procedure?  You will undress and remove any jewelry around your neck or chest.  You will put on a hospital gown.  Sticky electrodes will be placed on your chest. The electrodes will be connected to an electrocardiogram (ECG) machine to record a tracing of the electrical activity of your heart.  A CT scanner will take pictures of your heart. During this time, you will be asked to lie still and hold your breath for 2-3 seconds while a picture of your heart is being taken. The procedure may vary among health care providers and hospitals. What happens after the procedure?  You can get dressed.  You can return to your normal activities.  It is up to you to get the results of your test. Ask your health care provider, or the department that is doing the test, when your results will be ready. Summary  A coronary calcium scan is an imaging test used to look for deposits of calcium and other fatty materials (plaques) in the inner lining of the blood vessels of the heart (coronary arteries).  Generally, this is a safe procedure. Tell your health care provider if you are pregnant or may be pregnant.  No preparation is needed for this procedure.  A CT scanner will take pictures of your heart.  You can return to your normal activities after the scan is done. This information is not intended to replace advice given to you by your health care provider. Make sure you discuss any questions you have with your health care provider. Document Released: 02/22/2008 Document Revised: 07/15/2016 Document Reviewed:  07/15/2016 Elsevier Interactive Patient Education  2017 [place] Monitor Instructions   Your physician has requested you wear your ZIO patch monitor 14 days.   This is a single patch monitor.  [employer] supplies one patch monitor per enrollment.  Additional stickers are not available.   Please do not apply patch if you will be having a Nuclear Stress Test, Echocardiogram, Cardiac CT, MRI, or Chest Xray during the time frame you would be wearing the monitor. The patch cannot be worn during these tests.  You cannot remove and re-apply the ZIO XT patch monitor.   Your ZIO patch monitor will be sent USPS Priority mail from [facility] directly to your home address. The monitor may also be mailed to a PO BOX if home delivery is not available.   It may take 3-5 days to receive your monitor after you have been enrolled.   Once you have received you monitor, please review enclosed instructions.  Your monitor has already been registered assigning a specific monitor serial # to you.   Applying the monitor   Shave hair from upper left chest.   Hold abrader disc by orange tab.  Rub abrader in 40 strokes over left upper chest as indicated in your monitor instructions.   Clean area with 4 enclosed alcohol pads .  Use all pads to assure are is cleaned thoroughly.  Let dry.   Apply patch as indicated in monitor instructions.  Patch will be place under collarbone on left side of chest with arrow pointing upward.   Rub patch adhesive wings for 2 minutes.Remove white label marked "1".  Remove white label marked "2".  Rub patch adhesive wings for 2 additional minutes.   While looking in a mirror, press and release button in center of patch.  A small green light will flash 3-4 times .  This will be your only indicator the monitor has been turned on.     Do not shower for the first 24 hours.  You may shower after the first 24 hours.   Press button if you feel a symptom.  You will hear a small click.  Record Date, Time and Symptom in the Patient Log Book.   When you are ready to remove patch, follow instructions on last 2 pages of Patient Log Book.  Stick patch monitor onto last page of Patient Log Book.   Place Patient Log Book in [place] box.  Use locking tab on box and tape box closed securely.  The [employer] and [employer] has [employer] on it.  Please place in mailbox as soon as possible.  Your physician should have your test results approximately 7 days after the monitor has been mailed back to [facility].   Call [place] at [phone] if you have questions regarding your ZIO XT patch monitor.  Call them immediately if you see an orange light blinking on your monitor.   If your monitor falls off in less than 4 days contact our Monitor department at [phone].  If your monitor becomes loose or falls off after 4 days call [employer] at [phone] for suggestions on securing your monitor.    Follow-Up: At [facility], you and your health needs are our priority.  As part of our continuing mission to provide you with exceptional heart care, we have created designated Provider Care Teams.  These Care Teams include your primary Cardiologist (physician) and Advanced Practice Providers (APPs -  Physician Assistants and Nurse Practitioners) who all  work together to provide you with the care you need, when you need it.  We recommend signing up for the patient portal called "MyChart".  Sign up information is provided on this After Visit Summary.  MyChart is used to connect with patients for Virtual Visits (Telemedicine).  Patients are able to view lab/test results, encounter notes, upcoming appointments, etc.  Non-urgent messages can be sent to your provider as well.   To learn more about what you can do with MyChart, go to [email or web address].    Your next appointment:   3 month(s)  The format for your next appointment:   In  Person  Provider:   Oswaldo Milian, MD        Signed, Donato Heinz, MD  12/21/2019 10:43 PM    [place]

## 2019-12-21 ENCOUNTER — Other Ambulatory Visit: Payer: Self-pay

## 2019-12-21 ENCOUNTER — Encounter: Payer: Self-pay | Admitting: Cardiology

## 2019-12-21 ENCOUNTER — Ambulatory Visit: Payer: PRIVATE HEALTH INSURANCE | Admitting: Cardiology

## 2019-12-21 ENCOUNTER — Encounter: Payer: Self-pay | Admitting: *Deleted

## 2019-12-21 VITALS — BP 140/80 | HR 97 | Temp 97.0°F | Ht 62.0 in | Wt 114.0 lb

## 2019-12-21 DIAGNOSIS — E785 Hyperlipidemia, unspecified: Secondary | ICD-10-CM

## 2019-12-21 DIAGNOSIS — R002 Palpitations: Secondary | ICD-10-CM | POA: Diagnosis not present

## 2019-12-21 DIAGNOSIS — Z8249 Family history of ischemic heart disease and other diseases of the circulatory system: Secondary | ICD-10-CM | POA: Diagnosis not present

## 2019-12-21 NOTE — Patient Instructions (Signed)
Medication Instructions:  Your physician recommends that you continue on your current medications as directed. Please refer to the Current Medication list given to you today.  *If you need a refill on your cardiac medications before your next appointment, please call your pharmacy*   Lab Work: NONE  Testing/Procedures: CT coronary calcium score. This test is done at [address]. This is $150 out of pocket.   Coronary CalciumScan A coronary calcium scan is an imaging test used to look for deposits of calcium and other fatty materials (plaques) in the inner lining of the blood vessels of the heart (coronary arteries). These deposits of calcium and plaques can partly clog and narrow the coronary arteries without producing any symptoms or warning signs. This puts a person at risk for a heart attack. This test can detect these deposits before symptoms develop. Tell a health care provider about:  Any allergies you have.  All medicines you are taking, including vitamins, herbs, eye drops, creams, and over-the-counter medicines.  Any problems you or family members have had with anesthetic medicines.  Any blood disorders you have.  Any surgeries you have had.  Any medical conditions you have.  Whether you are pregnant or may be pregnant. What are the risks? Generally, this is a safe procedure. However, problems may occur, including:  Harm to a pregnant woman and her unborn baby. This test involves the use of radiation. Radiation exposure can be dangerous to a pregnant woman and her unborn baby. If you are pregnant, you generally should not have this procedure done.  Slight increase in the risk of cancer. This is because of the radiation involved in the test. What happens before the procedure? No preparation is needed for this procedure. What happens during the procedure?  You will undress and remove any jewelry around your neck or chest.  You will put on a  hospital gown.  Sticky electrodes will be placed on your chest. The electrodes will be connected to an electrocardiogram (ECG) machine to record a tracing of the electrical activity of your heart.  A CT scanner will take pictures of your heart. During this time, you will be asked to lie still and hold your breath for 2-3 seconds while a picture of your heart is being taken. The procedure may vary among health care providers and hospitals. What happens after the procedure?  You can get dressed.  You can return to your normal activities.  It is up to you to get the results of your test. Ask your health care provider, or the department that is doing the test, when your results will be ready. Summary  A coronary calcium scan is an imaging test used to look for deposits of calcium and other fatty materials (plaques) in the inner lining of the blood vessels of the heart (coronary arteries).  Generally, this is a safe procedure. Tell your health care provider if you are pregnant or may be pregnant.  No preparation is needed for this procedure.  A CT scanner will take pictures of your heart.  You can return to your normal activities after the scan is done. This information is not intended to replace advice given to you by your health care provider. Make sure you discuss any questions you have with your health care provider. Document Released: 02/22/2008 Document Revised: 07/15/2016 Document Reviewed: 07/15/2016 Elsevier Interactive Patient Education  2017 [place] Term Monitor Instructions   Your physician has  requested you wear your ZIO patch monitor 14 days.   This is a single patch monitor.  [employer] supplies one patch monitor per enrollment.  Additional stickers are not available.   Please do not apply patch if you will be having a Nuclear Stress Test, Echocardiogram, Cardiac CT, MRI, or Chest Xray during the time frame you would be wearing the monitor. The patch  cannot be worn during these tests.  You cannot remove and re-apply the ZIO XT patch monitor.   Your ZIO patch monitor will be sent USPS Priority mail from [facility] directly to your home address. The monitor may also be mailed to a PO BOX if home delivery is not available.   It may take 3-5 days to receive your monitor after you have been enrolled.   Once you have received you monitor, please review enclosed instructions.  Your monitor has already been registered assigning a specific monitor serial # to you.   Applying the monitor   Shave hair from upper left chest.   Hold abrader disc by orange tab.  Rub abrader in 40 strokes over left upper chest as indicated in your monitor instructions.   Clean area with 4 enclosed alcohol pads .  Use all pads to assure are is cleaned thoroughly.  Let dry.   Apply patch as indicated in monitor instructions.  Patch will be place under collarbone on left side of chest with arrow pointing upward.   Rub patch adhesive wings for 2 minutes.Remove white label marked "1".  Remove white label marked "2".  Rub patch adhesive wings for 2 additional minutes.   While looking in a mirror, press and release button in center of patch.  A small green light will flash 3-4 times .  This will be your only indicator the monitor has been turned on.     Do not shower for the first 24 hours.  You may shower after the first 24 hours.   Press button if you feel a symptom. You will hear a small click.  Record Date, Time and Symptom in the Patient Log Book.   When you are ready to remove patch, follow instructions on last 2 pages of Patient Log Book.  Stick patch monitor onto last page of Patient Log Book.   Place Patient Log Book in [place] box.  Use locking tab on box and tape box closed securely.  The [employer] and [employer] has [employer] on it.  Please place in mailbox as soon as possible.  Your physician should have your test results approximately 7 days after the  monitor has been mailed back to [facility].   Call [place] at [phone] if you have questions regarding your ZIO XT patch monitor.  Call them immediately if you see an orange light blinking on your monitor.   If your monitor falls off in less than 4 days contact our Monitor department at [phone].  If your monitor becomes loose or falls off after 4 days call [employer] at [phone] for suggestions on securing your monitor.    Follow-Up: At [facility], you and your health needs are our priority.  As part of our continuing mission to provide you with exceptional heart care, we have created designated Provider Care Teams.  These Care Teams include your primary Cardiologist (physician) and Advanced Practice Providers (APPs -  Physician Assistants and Nurse Practitioners) who all work together to provide you with the care you need, when you need it.  We  recommend signing up for the patient portal called "MyChart".  Sign up information is provided on this After Visit Summary.  MyChart is used to connect with patients for Virtual Visits (Telemedicine).  Patients are able to view lab/test results, encounter notes, upcoming appointments, etc.  Non-urgent messages can be sent to your provider as well.   To learn more about what you can do with MyChart, go to [email or web address].    Your next appointment:   3 month(s)  The format for your next appointment:   In Person  Provider:   Oswaldo Milian, MD

## 2019-12-21 NOTE — Progress Notes (Signed)
Patient ID: Connie Miller, female   DOB: 1955/10/18, 64 y.o.   MRN: [id number] Patient enrolled for [employer] to mail a 14 day ZIO XT long term holter monitor to her home.

## 2019-12-23 ENCOUNTER — Other Ambulatory Visit: Payer: Self-pay

## 2019-12-23 ENCOUNTER — Ambulatory Visit (INDEPENDENT_AMBULATORY_CARE_PROVIDER_SITE_OTHER)
Admission: RE | Admit: 2019-12-23 | Discharge: 2019-12-23 | Disposition: A | Discharge status: Home / Self Care | Payer: Self-pay | Source: Ambulatory Visit | Attending: Cardiology | Admitting: Cardiology

## 2019-12-23 DIAGNOSIS — E785 Hyperlipidemia, unspecified: Secondary | ICD-10-CM

## 2019-12-23 DIAGNOSIS — Z8249 Family history of ischemic heart disease and other diseases of the circulatory system: Secondary | ICD-10-CM

## 2019-12-27 ENCOUNTER — Encounter: Payer: PRIVATE HEALTH INSURANCE | Admitting: Adult Health

## 2019-12-27 ENCOUNTER — Other Ambulatory Visit: Payer: Self-pay | Admitting: *Deleted

## 2019-12-27 MED ORDER — ATORVASTATIN CALCIUM 40 MG PO TABS: 40.0000 mg | ORAL_TABLET | Freq: Every day | ORAL | 3 refills | Status: DC

## 2019-12-29 ENCOUNTER — Encounter: Payer: PRIVATE HEALTH INSURANCE | Admitting: Adult Health

## 2019-12-29 ENCOUNTER — Inpatient Hospital Stay: Admission: RE | Admit: 2019-12-29 | Payer: PRIVATE HEALTH INSURANCE | Source: Ambulatory Visit

## 2019-12-31 ENCOUNTER — Ambulatory Visit (INDEPENDENT_AMBULATORY_CARE_PROVIDER_SITE_OTHER): Payer: PRIVATE HEALTH INSURANCE

## 2019-12-31 DIAGNOSIS — R002 Palpitations: Secondary | ICD-10-CM | POA: Diagnosis not present

## 2020-01-10 NOTE — Progress Notes (Signed)
SURVIVORSHIP VISIT:   REASON FOR VISIT:  Routine follow-up for history of breast cancer.    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 Jovic presents to the [facility] today for routine follow-up for her history of breast cancer. Since her last visit, she underwent bilateral breast screening mammogram on 02/04/2019 that showed no evidence of malignancy and breast density category D.    She has another one scheduled on 01/26/2020.    She is wearing a 14 day cardiac event monitor that her cardiologist gave her to evaluate her palpitations.  She is f/u with him about this.    She is walking or riding her bike daily for an hour.  She is f/u with her PCP regularly.  She is up to date with her cancer screenings.    REVIEW OF SYSTEMS:  Review of Systems  Constitutional: Negative for appetite change, chills, fatigue, fever and unexpected weight change.  HENT:   Negative for hearing loss, lump/mass, sore throat and trouble swallowing.   Eyes: Negative for eye problems and icterus.  Respiratory: Negative for chest tightness, cough and shortness of breath.   Cardiovascular: Negative for chest pain, leg swelling and palpitations.  Gastrointestinal: Negative for abdominal distention, abdominal pain, constipation, diarrhea, nausea and vomiting.  Endocrine: Negative for hot flashes.  Musculoskeletal: Negative for arthralgias.  Skin: Negative for itching and rash.  Neurological:  Negative for dizziness, extremity weakness, headaches and numbness.  Hematological: Negative for adenopathy. Does not bruise/bleed easily.  Psychiatric/Behavioral: Negative for depression. The patient is not nervous/anxious.   Breast: Denies any new nodularity, masses, tenderness, nipple changes, or nipple discharge.    PAST MEDICAL/SURGICAL HISTORY:  Past Medical History:  Diagnosis Date  . Breast cancer, stage 0   . Cancer ([place]) 1996   skin cancer on eye lid  . Cervical polyp Feb. 2011   benign  . Ectopic pregnancy   . Fibroid uterus 2007  . Hyperlipidemia   . Migraine    1 every 6 weeks. food triggered/hormonal   Past Surgical History:  Procedure Laterality Date  . ABDOMINAL HYSTERECTOMY Bilateral 02/04/2014   Procedure: TOTAL ABDOMINAL HYSTERECTOMY, BILATERAL SALPINGO OOPHORECTOMY ;  Surgeon: [name], MD;  Location: [facility];  Service: Gynecology;  Laterality: Bilateral;  . BREAST LUMPECTOMY     right breast lumpectomy, snbx  . CERVICAL POLYPECTOMY  feb. 2011  . DILATION AND CURETTAGE OF UTERUS     several d &c - polyps and bleeding  . ECTOPIC PREGNANCY SURGERY  August 1997   Laparoscopy for ectopic  . HYSTEROSCOPY WITH D & C N/A 11/05/2013   Procedure: DILATATION AND CURETTAGE ;  Surgeon: [name], MD;  Location: [facility];  Service: Gynecology;  Laterality: N/A;   with Ultrasound guidance  . SKIN CANCER EXCISION  1996   removal of small spot on eyelid, noninvasive (basal cell) skin cancer  . TONSILLECTOMY    . WISDOM TOOTH EXTRACTION       ALLERGIES:  No  Known Allergies   CURRENT MEDICATIONS:  Outpatient Encounter Medications as of 01/11/2020  Medication Sig Note  . aspirin 81 MG tablet Take 81 mg by mouth at bedtime.    Marland Kitchen atorvastatin (LIPITOR) 40 MG tablet Take 1 tablet (40 mg total) by mouth daily.   Marland Kitchen CRANBERRY PO Take by mouth daily.   Marland Kitchen escitalopram (LEXAPRO) 5 MG tablet Take 2.5 mg by mouth daily.   . Multiple Vitamins-Minerals (CENTRUM  SILVER PO) Take 1 tablet by mouth daily.     . SUMAtriptan (IMITREX) 100 MG tablet Take 100 mg by mouth as needed for migraine.    . Vitamin D, Ergocalciferol, (DRISDOL) 50000 UNITS CAPS capsule Take 50,000 Units by mouth every 7 (seven) days.  12/12/2014: Received from: External Pharmacy   No facility-administered encounter medications on file as of 01/11/2020.     ONCOLOGIC FAMILY HISTORY:  Family History  Problem Relation Age of Onset  . Heart disease Father   . Early death Cousin     GENETIC COUNSELING/TESTING: Not at this time  SOCIAL HISTORY:  Social History   Socioeconomic History  . Marital status: Married    Spouse name: Not on file  . Number of children: Not on file  . Years of education: Not on file  . Highest education level: Not on file  Occupational History  . Not on file  Tobacco Use  . Smoking status: Never Smoker  . Smokeless tobacco: Never Used  Substance and Sexual Activity  . Alcohol use: No    Alcohol/week: 0.0 standard drinks  . Drug use: No  . Sexual activity: Yes    Partners: Male    Birth control/protection: Post-menopausal  Other Topics Concern  . Not on file  Social History Narrative  . Not on file   Social Determinants of Health   Financial Resource Strain:   . Difficulty of Paying Living Expenses:   Food Insecurity:   . Worried About Charity fundraiser in the Last Year:   . Arboriculturist in the Last Year:   Transportation Needs:   . Film/video editor (Medical):   Marland Kitchen Lack of Transportation (Non-Medical):   Physical Activity:   . Days of Exercise per Week:   . Minutes of Exercise per Session:   Stress:   . Feeling of Stress :   Social Connections:   . Frequency of Communication with Friends and Family:   . Frequency of Social Gatherings with Friends and Family:   . Attends Religious Services:   . Active Member of Clubs or Organizations:   . Attends Archivist Meetings:   Marland Kitchen Marital Status:   Intimate Partner  Violence:   . Fear of Current or Ex-Partner:   . Emotionally Abused:   Marland Kitchen Physically Abused:   . Sexually Abused:       OBJECTIVE:  BP 121/77 (BP Location: Left Arm, Patient Position: Sitting)   Pulse 93   Temp 98.9 F (37.2 C) (Temporal)   Resp 18   Ht 5\' 2"  (1.575 m)   Wt 112 lb 6.4 oz (51 kg)   SpO2 98%   BMI 20.56 kg/m  GENERAL: Patient is a well appearing female in no acute distress HEENT:  Sclerae anicteric.  Mask in place. Neck is supple.  NODES:  No cervical, supraclavicular, or axillary lymphadenopathy palpated.  BREAST EXAM:   LUNGS:  Clear to auscultation bilaterally.  No wheezes or rhonchi. HEART:  Regular rate and rhythm. No murmur appreciated. ABDOMEN:  Soft, nontender.  Positive, normoactive bowel sounds. No organomegaly palpated. MSK:  No focal spinal tenderness to palpation. Full range of motion bilaterally in the upper extremities. EXTREMITIES:  No peripheral edema.   SKIN:  Clear with no obvious rashes or skin changes. No nail dyscrasia. NEURO:  Nonfocal. Well oriented.  Appropriate affect.    LABORATORY DATA:  None for this visit   DIAGNOSTIC IMAGING:  Most recent mammogram: due 01/25/2020    ASSESSMENT AND PLAN:  Ms.. Ocegueda is a pleasant 64 y.o. 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  [facility] and routine follow-up.   1. History of breast cancer:  Ms. Ruelas is currently clinically and radiographically without evidence of disease or recurrence of breast cancer. She will be due for mammogram in one week and those orders are placed.  She has breast density category D.  I reviewed with her what that means (see #2).  She will return in 13 months for continued surveillance and monitoring.  I encouraged her to call me with any questions  or concerns before her next visit at the cancer center, and I would be happy to see her sooner, if needed.    2. Breast Density D: We again reviewed her breast density and supplemental MRI to evaluate her breasts fully.  We reviewed the increased sensitivity of breast MRI can mean increased false positive.  I reviewed the differences in cost, insurance filing, and time of the FAST MRI versus the full MRI.  She would like the full MRI.  I placed orders for the full MRI for 07/2020.  She will call if we need to change to FAST MRI if she learns that she has a high out of pocket responsibility.  .    3. Bone health:  Given Ms. Alvelo's age, history of breast cancer, she is at risk for bone demineralization. Her last bone density was in 2019 and a T score of -2.6 in her right femur.  She is taking vitamin d, calcium, and is very active with weight bearing exercises.  She will continue this.  Her PCP is following her bone density testing.  She was given education on specific food and activities to promote bone health.  4. Cancer screening:  Due to Ms. Cavitt's history and her age, she should receive screening for skin cancers, colon cancer, and gynecologic cancers. She was encouraged to follow-up with her PCP for appropriate cancer screenings.   5. Health maintenance and wellness promotion: Ms. Southwell was encouraged to consume 5-7 servings of fruits and vegetables per day. She was also encouraged to engage in moderate to vigorous exercise for 30 minutes per day most days of the week. She was instructed to limit her alcohol consumption and continue to abstain from tobacco use.    Follow up instructions:    -Return to cancer center in  13 months for LTS follow up -Mammogram due in 01/2020 -Breast MRI in 07/2020   She was recommended to continue with the appropriate pandemic precautions. She knows to call for any questions that may arise between now and her next appointment.  We are happy to see her  sooner if needed.  Total encounter time: 20 minutes   Gardenia Phlegm, NP [facility] [place] [phone]  *Total Encounter Time as defined by the  Centers for Medicare and Medicaid Services includes, in addition to the face-to-face time of a patient visit (documented in the note above) non-face-to-face time: obtaining and reviewing outside history, ordering and reviewing medications, tests or procedures, care coordination (communications with other health care professionals or caregivers) and documentation in the medical record.  Note: PRIMARY CARE PROVIDER Leanna Battles, [place] [phone]

## 2020-01-11 ENCOUNTER — Other Ambulatory Visit: Payer: Self-pay

## 2020-01-11 ENCOUNTER — Inpatient Hospital Stay: Payer: Medicare Other | Attending: Adult Health | Admitting: Adult Health

## 2020-01-11 ENCOUNTER — Encounter: Payer: Self-pay | Admitting: Adult Health

## 2020-01-11 VITALS — BP 121/77 | HR 93 | Temp 98.9°F | Resp 18 | Ht 62.0 in | Wt 112.4 lb

## 2020-01-11 DIAGNOSIS — Z79899 Other long term (current) drug therapy: Secondary | ICD-10-CM | POA: Diagnosis not present

## 2020-01-11 DIAGNOSIS — Z923 Personal history of irradiation: Secondary | ICD-10-CM | POA: Insufficient documentation

## 2020-01-11 DIAGNOSIS — Z853 Personal history of malignant neoplasm of breast: Secondary | ICD-10-CM | POA: Diagnosis present

## 2020-01-11 DIAGNOSIS — Z85828 Personal history of other malignant neoplasm of skin: Secondary | ICD-10-CM | POA: Diagnosis not present

## 2020-01-11 DIAGNOSIS — Z90722 Acquired absence of ovaries, bilateral: Secondary | ICD-10-CM | POA: Diagnosis not present

## 2020-01-11 DIAGNOSIS — Z8249 Family history of ischemic heart disease and other diseases of the circulatory system: Secondary | ICD-10-CM | POA: Diagnosis not present

## 2020-01-11 DIAGNOSIS — D0591 Unspecified type of carcinoma in situ of right breast: Secondary | ICD-10-CM | POA: Diagnosis not present

## 2020-02-01 ENCOUNTER — Telehealth: Payer: Self-pay | Admitting: *Deleted

## 2020-02-01 NOTE — Telephone Encounter (Signed)
RN placed call to pt regaring recent bone density report showing T score -3.3.  Pt states she is working  with her PCP Dr. Sharlett Iles with her bone health.  Pt appreciative of the call and states she will call her PCP for further workup and treatment.

## 2020-02-04 ENCOUNTER — Other Ambulatory Visit: Payer: Self-pay | Admitting: *Deleted

## 2020-02-04 DIAGNOSIS — I4729 Other ventricular tachycardia: Secondary | ICD-10-CM

## 2020-02-04 DIAGNOSIS — I472 Ventricular tachycardia: Secondary | ICD-10-CM

## 2020-02-28 ENCOUNTER — Other Ambulatory Visit: Payer: Self-pay

## 2020-02-28 ENCOUNTER — Ambulatory Visit (HOSPITAL_COMMUNITY): Payer: Medicare Other | Attending: Cardiology

## 2020-02-28 DIAGNOSIS — I472 Ventricular tachycardia: Secondary | ICD-10-CM | POA: Diagnosis not present

## 2020-02-28 DIAGNOSIS — I4729 Other ventricular tachycardia: Secondary | ICD-10-CM

## 2020-03-29 ENCOUNTER — Encounter: Payer: Self-pay | Admitting: Cardiology

## 2020-03-29 ENCOUNTER — Other Ambulatory Visit: Payer: Self-pay

## 2020-03-29 ENCOUNTER — Ambulatory Visit (INDEPENDENT_AMBULATORY_CARE_PROVIDER_SITE_OTHER): Payer: Medicare Other | Admitting: Cardiology

## 2020-03-29 VITALS — BP 108/76 | HR 76 | Ht 62.0 in | Wt 115.2 lb

## 2020-03-29 DIAGNOSIS — I2510 Atherosclerotic heart disease of native coronary artery without angina pectoris: Secondary | ICD-10-CM | POA: Diagnosis not present

## 2020-03-29 DIAGNOSIS — E785 Hyperlipidemia, unspecified: Secondary | ICD-10-CM

## 2020-03-29 DIAGNOSIS — R42 Dizziness and giddiness: Secondary | ICD-10-CM

## 2020-03-29 DIAGNOSIS — R002 Palpitations: Secondary | ICD-10-CM

## 2020-03-29 LAB — LIPID PANEL
Chol/HDL Ratio: 2 ratio (ref 0.0–4.4)
Cholesterol, Total: 136 mg/dL (ref 100–199)
HDL: 69 mg/dL (ref >39)
LDL Chol Calc (NIH): 56 mg/dL (ref 0–99)
Triglycerides: 46 mg/dL (ref 0–149)
VLDL Cholesterol Cal: 11 mg/dL (ref 5–40)

## 2020-03-29 MED ORDER — MECLIZINE HCL 12.5 MG PO TABS: 12.5000 mg | ORAL_TABLET | Freq: Three times a day (TID) | ORAL | 0 refills | Status: DC | PRN

## 2020-03-29 NOTE — Patient Instructions (Signed)
Medication Instructions:  START meclizine 12.5 mg (1 tablet) three times daily AS NEEDED-follow up with your primary doctor  *If you need a refill on your cardiac medications before your next appointment, please call your pharmacy*   Lab Work: Lipid panel today  If you have labs (blood work) drawn today and your tests are completely normal, you will receive your results only by: Marland Kitchen MyChart Message (if you have MyChart) OR . A paper copy in the mail If you have any lab test that is abnormal or we need to change your treatment, we will call you to review the results.  Follow-Up: At [facility], you and your health needs are our priority.  As part of our continuing mission to provide you with exceptional heart care, we have created designated Provider Care Teams.  These Care Teams include your primary Cardiologist (physician) and Advanced Practice Providers (APPs -  Physician Assistants and Nurse Practitioners) who all work together to provide you with the care you need, when you need it.  We recommend signing up for the patient portal called "MyChart".  Sign up information is provided on this After Visit Summary.  MyChart is used to connect with patients for Virtual Visits (Telemedicine).  Patients are able to view lab/test results, encounter notes, upcoming appointments, etc.  Non-urgent messages can be sent to your provider as well.   To learn more about what you can do with MyChart, go to [email or web address].    Your next appointment:   12 month(s)  The format for your next appointment:   In Person  Provider:   Oswaldo Milian, MD

## 2020-03-29 NOTE — Progress Notes (Signed)
Cardiology Office Note:    Date:  03/29/2020   ID:  Connie Miller, DOB 1955/10/25, MRN [id number]  PCP:  Connie Battles, MD  Cardiologist:  Connie Heinz, MD  Electrophysiologist:  None   Referring MD: Connie Battles, MD   Chief Complaint  Patient presents with  . Coronary Artery Disease    History of Present Illness:    Connie Miller is a 64 y.o. female with a hx of breast cancer, skin cancer, hyperlipidemia who presents for follow-up.  She was referred by Dr. Philip Miller for evaluation of palpitations.  She reports that she received her second COVID-19 vaccine on March 10.  At night she had what she thought was a panic attack.  States that her heart was racing and she felt very anxious.  Episode lasted for hours.  She has had a couple more episodes of this since that time.  States that she has been walking every morning for 45 minutes.  She denies any dyspnea or chest pain.  Smoked briefly when younger.  Father had MI at age 42, died of MI at age 70.  Zio patch x14 days on 01/31/2020 showed one 8 beat episode of NSVT.  TTE on 02/28/20 showed normal biventricular function, no significant valvular disease.  Calcium score on 12/23/2019 was 125 (85th percentile).  Since last clinic visit, she reports her palpitations have significantly improved, only occurring about once per week.  Lasts for 10 seconds or so and resolves.  She denies any chest pain or dyspnea.  States that she has been running 3 to 4 miles every other day for about 70 minutes.  Does report she has been having some lightheadedness, that she described as feeling like the room is spinning when she makes certain head movements.   Past Medical History:  Diagnosis Date  . Breast cancer, stage 0   . 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 Cervical polyp Feb. 2011   benign  . Ectopic pregnancy   . Fibroid uterus 2007  . Hyperlipidemia   . Migraine    1 every 6 weeks. food triggered/hormonal    Past  Surgical History:  Procedure Laterality Date  . ABDOMINAL HYSTERECTOMY Bilateral 02/04/2014   Procedure: TOTAL ABDOMINAL HYSTERECTOMY, BILATERAL SALPINGO OOPHORECTOMY ;  Surgeon: Connie Crocker, MD;  Location: [facility];  Service: Gynecology;  Laterality: Bilateral;  . BREAST LUMPECTOMY     right breast lumpectomy, snbx  . CERVICAL POLYPECTOMY  feb. 2011  . DILATION AND CURETTAGE OF UTERUS     several d &c - polyps and bleeding  . ECTOPIC PREGNANCY SURGERY  August 1997   Laparoscopy for ectopic  . HYSTEROSCOPY WITH D & C N/A 11/05/2013   Procedure: DILATATION AND CURETTAGE ;  Surgeon: Connie Crocker, MD;  Location: [facility];  Service: Gynecology;  Laterality: N/A;   with Ultrasound guidance  . SKIN CANCER EXCISION  1996   removal of small spot on eyelid, noninvasive (basal cell) skin cancer  . TONSILLECTOMY    . WISDOM TOOTH EXTRACTION      Current Medications: Current Meds  Medication Sig  . atorvastatin (LIPITOR) 40 MG tablet Take 1 tablet (40 mg total) by mouth daily.  Connie Miller CRANBERRY PO Take by mouth daily.  . Multiple Vitamins-Minerals (CENTRUM SILVER PO) Take 1 tablet by mouth daily.    . SUMAtriptan (IMITREX) 100 MG tablet Take 100 mg by mouth as needed for migraine.   . Vitamin D, Ergocalciferol, (DRISDOL) 50000  UNITS CAPS capsule Take 50,000 Units by mouth every 7 (seven) days.      Allergies:   Patient has no known allergies.   Social History   Socioeconomic History  . Marital status: Married    Spouse name: Not on file  . Number of children: Not on file  . Years of education: Not on file  . Highest education level: Not on file  Occupational History  . Not on file  Tobacco Use  . Smoking status: Never Smoker  . Smokeless tobacco: Never Used  Substance and Sexual Activity  . Alcohol use: No    Alcohol/week: 0.0 standard drinks  . Drug use: No  . Sexual activity: Yes    Partners: Male    Birth control/protection: Post-menopausal  Other Topics Concern  . Not on  file  Social History Narrative  . Not on file   Social Determinants of Health   Financial Resource Strain:   . Difficulty of Paying Living Expenses:   Food Insecurity:   . Worried About Charity fundraiser in the Last Year:   . Arboriculturist in the Last Year:   Transportation Needs:   . Film/video editor (Medical):   Connie Miller Lack of Transportation (Non-Medical):   Physical Activity:   . Days of Exercise per Week:   . Minutes of Exercise per Session:   Stress:   . Feeling of Stress :   Social Connections:   . Frequency of Communication with Friends and Family:   . Frequency of Social Gatherings with Friends and Family:   . Attends Religious Services:   . Active Member of Clubs or Organizations:   . Attends Archivist Meetings:   Connie Miller Marital Status:      Family History: The patient's family history includes Early death in her cousin; Heart disease in her father.  ROS:   Please see the history of present illness.     All other systems reviewed and are negative.  EKGs/Labs/Other Studies Reviewed:    The following studies were reviewed today:   EKG:  EKG is ordered today.  The ekg ordered today demonstrates NSR, rate 97,  Recent Labs: No results found for requested labs within last 8760 hours.  Recent Lipid Panel No results found for: CHOL, TRIG, HDL, CHOLHDL, VLDL, LDLCALC, LDLDIRECT  Physical Exam:    VS:  BP 108/76   Pulse 76   Ht 5\' 2"  (1.575 m)   Wt 115 lb 3.2 oz (52.3 kg)   SpO2 98%   BMI 21.07 kg/m     Wt Readings from Last 3 Encounters:  03/29/20 115 lb 3.2 oz (52.3 kg)  01/11/20 112 lb 6.4 oz (51 kg)  12/21/19 114 lb (51.7 kg)     GEN:  Well nourished, well developed in no acute distress HEENT: Normal NECK: No JVD; No carotid bruits LYMPHATICS: No lymphadenopathy CARDIAC: RRR, no murmurs, rubs, gallops RESPIRATORY:  Clear to auscultation without rales, wheezing or rhonchi  ABDOMEN: Soft, non-tender, non-distended MUSCULOSKELETAL:  No  edema; No deformity  SKIN: Warm and dry NEUROLOGIC:  Alert and oriented x 3 PSYCHIATRIC:  Normal affect   ASSESSMENT:    1. Coronary artery disease involving native coronary artery of native heart without angina pectoris   2. Hyperlipidemia, unspecified hyperlipidemia type   3. Palpitations   4. Lightheadedness    PLAN:    CAD: Calcium score on 12/23/2019 was 125 (85th percentile).  Denies any anginal symptoms. -Continue atorvastatin 40 mg  daily  Palpitations: description concerning for panic attack.  Zio patch x14 days showed no significant arrhythmia.  Did have one 8 beat episode of NSVT.  Echocardiogram shows no structural heart disease.  She reports her palpitations have improved significantly.  Lightheadedness: Describes sensation of the room is spinning with certain head movements, suggestive of BPPV.  Will prescribe meclizine 12.5 mg 3 times daily as needed and recommend follow-up with Dr. Philip Miller for further evaluation.  Hyperlipidemia: LDL 116 06/2019 on pravastatin 20 mg daily.  10 year ASCVD risk score 5%.  Calcium score on 12/23/2019 was 125 (85th percentile).  Switched to atorvastatin 40 mg daily in April 2021, will recheck lipid panel  RTC in 1 year   Medication Adjustments/Labs and Tests Ordered: Current medicines are reviewed at length with the patient today.  Concerns regarding medicines are outlined above.  Orders Placed This Encounter  Procedures  . Lipid panel   Meds ordered this encounter  Medications  . meclizine (ANTIVERT) 12.5 MG tablet    Sig: Take 1 tablet (12.5 mg total) by mouth 3 (three) times daily as needed for dizziness.    Dispense:  30 tablet    Refill:  0    Patient Instructions  Medication Instructions:  START meclizine 12.5 mg (1 tablet) three times daily AS NEEDED-follow up with your primary doctor  *If you need a refill on your cardiac medications before your next appointment, please call your pharmacy*   Lab Work: Lipid panel  today  If you have labs (blood work) drawn today and your tests are completely normal, you will receive your results only by: Connie Miller MyChart Message (if you have MyChart) OR . A paper copy in the mail If you have any lab test that is abnormal or we need to change your treatment, we will call you to review the results.  Follow-Up: At [facility], you and your health needs are our priority.  As part of our continuing mission to provide you with exceptional heart care, we have created designated Provider Care Teams.  These Care Teams include your primary Cardiologist (physician) and Advanced Practice Providers (APPs -  Physician Assistants and Nurse Practitioners) who all work together to provide you with the care you need, when you need it.  We recommend signing up for the patient portal called "MyChart".  Sign up information is provided on this After Visit Summary.  MyChart is used to connect with patients for Virtual Visits (Telemedicine).  Patients are able to view lab/test results, encounter notes, upcoming appointments, etc.  Non-urgent messages can be sent to your provider as well.   To learn more about what you can do with MyChart, go to [email or web address].    Your next appointment:   12 month(s)  The format for your next appointment:   In Person  Provider:   Oswaldo Milian, MD      Signed, Connie Heinz, MD  03/29/2020 9:37 AM    [place]

## 2020-04-13 ENCOUNTER — Other Ambulatory Visit: Payer: Medicare Other

## 2020-04-13 ENCOUNTER — Other Ambulatory Visit: Payer: Self-pay

## 2020-04-13 DIAGNOSIS — Z20822 Contact with and (suspected) exposure to covid-19: Secondary | ICD-10-CM

## 2020-04-14 LAB — SARS-COV-2, NAA 2 DAY TAT

## 2020-04-14 LAB — NOVEL CORONAVIRUS, NAA: SARS-CoV-2, NAA: NOT DETECTED

## 2020-06-07 ENCOUNTER — Encounter: Payer: Self-pay | Admitting: Adult Health

## 2020-06-08 ENCOUNTER — Telehealth: Payer: Self-pay

## 2020-06-08 NOTE — Telephone Encounter (Signed)
Spoke with pt this morning regarding MRI which has not yet been scheduled. This LPN sent message for PA to get MRI scheduled. Informed pt I would call her with appt day/time. Pt verbalized thanks and understanding.

## 2020-06-08 NOTE — Telephone Encounter (Signed)
Pt has been scheduled for MRI for 07/13/20 at 1200. Pt understands she should arrive to [facility] admissions at 1130. Pt is in agreement with this date/time.

## 2020-06-26 ENCOUNTER — Encounter: Payer: Self-pay | Admitting: Adult Health

## 2020-07-13 ENCOUNTER — Ambulatory Visit (HOSPITAL_COMMUNITY): Payer: Medicare Other

## 2020-07-26 ENCOUNTER — Other Ambulatory Visit: Payer: Medicare Other

## 2020-07-26 ENCOUNTER — Other Ambulatory Visit: Payer: Self-pay

## 2020-07-26 DIAGNOSIS — Z20822 Contact with and (suspected) exposure to covid-19: Secondary | ICD-10-CM

## 2020-07-27 LAB — SARS-COV-2, NAA 2 DAY TAT

## 2020-07-27 LAB — NOVEL CORONAVIRUS, NAA: SARS-CoV-2, NAA: NOT DETECTED

## 2020-08-15 ENCOUNTER — Ambulatory Visit (HOSPITAL_COMMUNITY): Payer: Medicare Other

## 2020-09-18 ENCOUNTER — Ambulatory Visit (HOSPITAL_COMMUNITY)
Admission: RE | Admit: 2020-09-18 | Discharge: 2020-09-18 | Disposition: A | Discharge status: Home / Self Care | Payer: Medicare Other | Source: Ambulatory Visit | Attending: Adult Health | Admitting: Adult Health

## 2020-09-18 ENCOUNTER — Other Ambulatory Visit: Payer: Self-pay

## 2020-09-18 DIAGNOSIS — D0591 Unspecified type of carcinoma in situ of right breast: Secondary | ICD-10-CM | POA: Diagnosis present

## 2020-09-18 MED ORDER — GADOBUTROL 1 MMOL/ML IV SOLN
5.0000 mL | Freq: Once | INTRAVENOUS | Status: AC | PRN
  Administered 2020-09-18: 5 mL via INTRAVENOUS

## 2021-01-31 ENCOUNTER — Telehealth: Payer: Self-pay | Admitting: Adult Health

## 2021-01-31 NOTE — Telephone Encounter (Signed)
Rescheduled appointment per provider template. Left a detailed message.

## 2021-02-12 ENCOUNTER — Encounter: Payer: Medicare Other | Admitting: Adult Health

## 2021-02-14 ENCOUNTER — Telehealth: Payer: Self-pay | Admitting: Adult Health

## 2021-02-14 NOTE — Telephone Encounter (Signed)
Scheduled appointment per 06/08 sch msg. Patient is aware. 

## 2021-02-19 ENCOUNTER — Encounter: Payer: Medicare Other | Admitting: Adult Health

## 2021-04-01 NOTE — Progress Notes (Deleted)
Cardiology Office Note:    Date:  04/01/2021   ID:  Connie Miller, DOB 1956-07-26, MRN [id number]  PCP:  Leanna Battles, MD  Cardiologist:  Donato Heinz, MD  Electrophysiologist:  None   Referring MD: Leanna Battles, MD   No chief complaint on file.   History of Present Illness:    Connie Miller is a 65 y.o. female with a hx of breast cancer, skin cancer, hyperlipidemia who presents for follow-up.  She was referred by Dr. Philip Aspen for evaluation of palpitations, initially seen on 12/21/2019.  She reports that she received her second COVID-19 vaccine on March 10.  At night she had what she thought was a panic attack.  States that her heart was racing and she felt very anxious.  Episode lasted for hours.  She has had a couple more episodes of this since that time.  States that she has been walking every morning for 45 minutes.  She denies any dyspnea or chest pain.  Smoked briefly when younger.  Father had MI at age 22, died of MI at age 39.  Zio patch x14 days on 01/31/2020 showed one 8 beat episode of NSVT.  TTE on 02/28/20 showed normal biventricular function, no significant valvular disease.  Calcium score on 12/23/2019 was 125 (85th percentile).  Since last clinic visit,  she reports her palpitations have significantly improved, only occurring about once per week.  Lasts for 10 seconds or so and resolves.  She denies any chest pain or dyspnea.  States that she has been running 3 to 4 miles every other day for about 70 minutes.  Does report she has been having some lightheadedness, that she described as feeling like the room is spinning when she makes certain head movements.   Past Medical History:  Diagnosis Date   Breast cancer, stage 0    Cancer ([place]) 1996   skin cancer on eye lid   Cervical polyp Feb. 2011   benign   Ectopic pregnancy    Fibroid uterus 2007   Hyperlipidemia    Migraine    1 every 6 weeks. food triggered/hormonal    Past Surgical History:   Procedure Laterality Date   ABDOMINAL HYSTERECTOMY Bilateral 02/04/2014   Procedure: TOTAL ABDOMINAL HYSTERECTOMY, BILATERAL SALPINGO OOPHORECTOMY ;  Surgeon: [name], MD;  Location: [facility];  Service: Gynecology;  Laterality: Bilateral;   BREAST LUMPECTOMY     right breast lumpectomy, snbx   CERVICAL POLYPECTOMY  feb. 2011   DILATION AND CURETTAGE OF UTERUS     several d &c - polyps and bleeding   ECTOPIC PREGNANCY SURGERY  August 1997   Laparoscopy for ectopic   HYSTEROSCOPY WITH D & C N/A 11/05/2013   Procedure: DILATATION AND CURETTAGE ;  Surgeon: [name], MD;  Location: [facility];  Service: Gynecology;  Laterality: N/A;   with Ultrasound guidance   SKIN CANCER EXCISION  1996   removal of small spot on eyelid, noninvasive (basal cell) skin cancer   TONSILLECTOMY     WISDOM TOOTH EXTRACTION      Current Medications: No outpatient medications have been marked as taking for the 04/02/21 encounter (Appointment) with Donato Heinz, MD.     Allergies:   Patient has no known allergies.   Social History   Socioeconomic History   Marital status: Married    Spouse name: Not on file   Number of children: Not on file   Years of education: Not on file   Highest  education level: Not on file  Occupational History   Not on file  Tobacco Use   Smoking status: Never   Smokeless tobacco: Never  Substance and Sexual Activity   Alcohol use: No    Alcohol/week: 0.0 standard drinks   Drug use: No   Sexual activity: Yes    Partners: Male    Birth control/protection: Post-menopausal  Other Topics Concern   Not on file  Social History Narrative   Not on file   Social Determinants of Health   Financial Resource Strain: Not on file  Food Insecurity: Not on file  Transportation Needs: Not on file  Physical Activity: Not on file  Stress: Not on file  Social Connections: Not on file     Family History: The patient's family history includes Early death in  her cousin; Heart disease in her father.  ROS:   Please see the history of present illness.     All other systems reviewed and are negative.  EKGs/Labs/Other Studies Reviewed:    The following studies were reviewed today:   EKG:  EKG is ordered today.  The ekg ordered today demonstrates NSR, rate 97,  Recent Labs: No results found for requested labs within last 8760 hours.  Recent Lipid Panel    Component Value Date/Time   CHOL 136 03/29/2020 0944   TRIG 46 03/29/2020 0944   HDL 69 03/29/2020 0944   CHOLHDL 2.0 03/29/2020 0944   LDLCALC 56 03/29/2020 0944    Physical Exam:    VS:  There were no vitals taken for this visit.    Wt Readings from Last 3 Encounters:  03/29/20 115 lb 3.2 oz (52.3 kg)  01/11/20 112 lb 6.4 oz (51 kg)  12/21/19 114 lb (51.7 kg)     GEN:  Well nourished, well developed in no acute distress HEENT: Normal NECK: No JVD; No carotid bruits LYMPHATICS: No lymphadenopathy CARDIAC: RRR, no murmurs, rubs, gallops RESPIRATORY:  Clear to auscultation without rales, wheezing or rhonchi  ABDOMEN: Soft, non-tender, non-distended MUSCULOSKELETAL:  No edema; No deformity  SKIN: Warm and dry NEUROLOGIC:  Alert and oriented x 3 PSYCHIATRIC:  Normal affect   ASSESSMENT:    No diagnosis found.  PLAN:    CAD: Calcium score on 12/23/2019 was 125 (85th percentile).  Denies any anginal symptoms. -Continue atorvastatin 40 mg daily  Palpitations: description concerning for panic attack.  Zio patch x14 days showed no significant arrhythmia.  Did have one 8 beat episode of NSVT.  Echocardiogram shows no structural heart disease.  She reports her palpitations have improved significantly.  Lightheadedness: Describes sensation of the room is spinning with certain head movements, suggestive of BPPV.  Will prescribe meclizine 12.5 mg 3 times daily as needed and recommend follow-up with Dr. Philip Aspen for further evaluation.  Hyperlipidemia: On atorvastatin 40 mg daily.   LDL 66 on 07/10/2020, at goal LDL less than 70..  Calcium score on 12/23/2019 was 125 (85th percentile).    RTC in 1 year   Medication Adjustments/Labs and Tests Ordered: Current medicines are reviewed at length with the patient today.  Concerns regarding medicines are outlined above.  No orders of the defined types were placed in this encounter.  No orders of the defined types were placed in this encounter.   There are no Patient Instructions on file for this visit.   Signed, Donato Heinz, MD  04/01/2021 2:40 PM    [facility]

## 2021-04-02 ENCOUNTER — Other Ambulatory Visit: Payer: Self-pay

## 2021-04-02 ENCOUNTER — Ambulatory Visit (INDEPENDENT_AMBULATORY_CARE_PROVIDER_SITE_OTHER): Payer: Medicare Other | Admitting: Cardiology

## 2021-04-02 ENCOUNTER — Encounter: Payer: Self-pay | Admitting: Cardiology

## 2021-04-02 VITALS — BP 108/78 | HR 68 | Resp 18 | Ht 62.0 in | Wt 113.0 lb

## 2021-04-02 DIAGNOSIS — R42 Dizziness and giddiness: Secondary | ICD-10-CM

## 2021-04-02 DIAGNOSIS — E785 Hyperlipidemia, unspecified: Secondary | ICD-10-CM | POA: Diagnosis not present

## 2021-04-02 DIAGNOSIS — R002 Palpitations: Secondary | ICD-10-CM | POA: Diagnosis not present

## 2021-04-02 DIAGNOSIS — I2510 Atherosclerotic heart disease of native coronary artery without angina pectoris: Secondary | ICD-10-CM

## 2021-04-02 NOTE — Progress Notes (Signed)
Cardiology Office Note:    Date:  04/08/2021   ID:  [name], DOB 19-Jul-1956, MRN [id number]  PCP:  Leanna Battles, MD  Cardiologist:  Donato Heinz, MD  Electrophysiologist:  None   Referring MD: Leanna Battles, MD   Chief Complaint  Patient presents with   Coronary Artery Disease     History of Present Illness:    Connie Miller is a 65 y.o. female with a hx of breast cancer, skin cancer, hyperlipidemia who presents for follow-up.  She was referred by Dr. Philip Aspen for evaluation of palpitations, initially seen on 12/21/2019.  She reports that she received her second COVID-19 vaccine on March 10.  At night she had what she thought was a panic attack.  States that her heart was racing and she felt very anxious.  Episode lasted for hours.  She has had a couple more episodes of this since that time.  States that she has been walking every morning for 45 minutes.  She denies any dyspnea or chest pain.  Smoked briefly when younger.  Father had MI at age 34, died of MI at age 32.  Zio patch x14 days on 01/31/2020 showed one 8 beat episode of NSVT.  TTE on 02/28/20 showed normal biventricular function, no significant valvular disease.  Calcium score on 12/23/2019 was 125 (85th percentile).  Since last clinic visit, she has been doing much better.  Palpitations now occur rarely and last for few seconds. She has vertigo and experience lightheadedness however has improved. She relieves the lightheadedness by lying down. She discontinued Meclizine 12.5 MG due to improvement with lightheadedness. Denies any chest pains or shortness of breath. 2 months ago, she was walking 4 days a week for 45 minutes, however she recently started swimming with no issues.     Past Medical History:  Diagnosis Date   Breast cancer, stage 0    Cancer ([place]) 1996   skin cancer on eye lid   Cervical polyp Feb. 2011   benign   Ectopic pregnancy    Fibroid uterus 2007   Hyperlipidemia    Migraine     1 every 6 weeks. food triggered/hormonal    Past Surgical History:  Procedure Laterality Date   ABDOMINAL HYSTERECTOMY Bilateral 02/04/2014   Procedure: TOTAL ABDOMINAL HYSTERECTOMY, BILATERAL SALPINGO OOPHORECTOMY ;  Surgeon: [name], MD;  Location: [facility];  Service: Gynecology;  Laterality: Bilateral;   BREAST LUMPECTOMY     right breast lumpectomy, snbx   CERVICAL POLYPECTOMY  feb. 2011   DILATION AND CURETTAGE OF UTERUS     several d &c - polyps and bleeding   ECTOPIC PREGNANCY SURGERY  August 1997   Laparoscopy for ectopic   HYSTEROSCOPY WITH D & C N/A 11/05/2013   Procedure: DILATATION AND CURETTAGE ;  Surgeon: [name], MD;  Location: [facility];  Service: Gynecology;  Laterality: N/A;   with Ultrasound guidance   SKIN CANCER EXCISION  1996   removal of small spot on eyelid, noninvasive (basal cell) skin cancer   TONSILLECTOMY     WISDOM TOOTH EXTRACTION      Current Medications: Current Meds  Medication Sig   Cholecalciferol (VITAMIN D3) 25 MCG (1000 UT) CAPS Take 1,000 Units by mouth daily. Pt takes 2,000 units daily   CRANBERRY PO Take by mouth daily.   meclizine (ANTIVERT) 12.5 MG tablet Take 1 tablet (12.5 mg total) by mouth 3 (three) times daily as needed for dizziness.   Multiple Vitamins-Minerals (CENTRUM SILVER  PO) Take 1 tablet by mouth daily.     SUMAtriptan (IMITREX) 100 MG tablet Take 100 mg by mouth as needed for migraine.    Vitamin D, Ergocalciferol, (DRISDOL) 50000 UNITS CAPS capsule Take 50,000 Units by mouth every 7 (seven) days.      Allergies:   Patient has no known allergies.   Social History   Socioeconomic History   Marital status: Married    Spouse name: Not on file   Number of children: Not on file   Years of education: Not on file   Highest education level: Not on file  Occupational History   Not on file  Tobacco Use   Smoking status: Never   Smokeless tobacco: Never  Substance and Sexual Activity   Alcohol use: No     Alcohol/week: 0.0 standard drinks   Drug use: No   Sexual activity: Yes    Partners: Male    Birth control/protection: Post-menopausal  Other Topics Concern   Not on file  Social History Narrative   Not on file   Social Determinants of Health   Financial Resource Strain: Not on file  Food Insecurity: Not on file  Transportation Needs: Not on file  Physical Activity: Not on file  Stress: Not on file  Social Connections: Not on file     Family History: The patient's family history includes Early death in her cousin; Heart disease in her father.  ROS:   Please see the history of present illness.     (+) lightheadedness (+) palpitations  (+) vertigo  All other systems reviewed and are negative.  EKGs/Labs/Other Studies Reviewed:    The following studies were reviewed today:   EKG:   07/22: sinus rhythm, rate 68, no ST abnormalities 07/21: NSR, rate 97  Recent Labs: No results found for requested labs within last 8760 hours.  Recent Lipid Panel    Component Value Date/Time   CHOL 136 03/29/2020 0944   TRIG 46 03/29/2020 0944   HDL 69 03/29/2020 0944   CHOLHDL 2.0 03/29/2020 0944   LDLCALC 56 03/29/2020 0944    Physical Exam:    VS:  BP 108/78   Pulse 68   Resp 18   Ht '5\' 2"'$  (1.575 m)   Wt 113 lb (51.3 kg)   BMI 20.67 kg/m     Wt Readings from Last 3 Encounters:  04/02/21 113 lb (51.3 kg)  03/29/20 115 lb 3.2 oz (52.3 kg)  01/11/20 112 lb 6.4 oz (51 kg)     GEN:  Well nourished, well developed in no acute distress HEENT: Normal NECK: No JVD; No carotid bruits CARDIAC: RRR, no murmurs, rubs, gallops RESPIRATORY:  Clear to auscultation without rales, wheezing or rhonchi  ABDOMEN: Soft, non-tender, non-distended MUSCULOSKELETAL:  No edema; No deformity  SKIN: Warm and dry NEUROLOGIC:  Alert and oriented x 3 PSYCHIATRIC:  Normal affect   ASSESSMENT:    1. Coronary artery disease involving native coronary artery of native heart without angina  pectoris   2. Palpitations   3. Lightheadedness   4. Hyperlipidemia, unspecified hyperlipidemia type     PLAN:    CAD: Calcium score on 12/23/2019 was 125 (85th percentile).  Denies any anginal symptoms. -Continue atorvastatin 40 mg daily  Palpitations: description concerning for panic attack.  Zio patch x14 days showed no significant arrhythmia.  Did have one 8 beat episode of NSVT.  Echocardiogram shows no structural heart disease.  She reports her palpitations have improved significantly.  Lightheadedness: Describes  sensation of the room is spinning with certain head movements, suggestive of BPPV.  Prescribed as needed meclizine, but reports symptoms have improved and has not had to use  Hyperlipidemia: On atorvastatin 40 mg daily.  LDL 56 on 03/29/2020.  Calcium score on 12/23/2019 was 125 (85th percentile).    RTC in 1 year   Medication Adjustments/Labs and Tests Ordered: Current medicines are reviewed at length with the patient today.  Concerns regarding medicines are outlined above.  Orders Placed This Encounter  Procedures   EKG 12-Lead    No orders of the defined types were placed in this encounter.   Patient Instructions  Medication Instructions:  Your physician recommends that you continue on your current medications as directed. Please refer to the Current Medication list given to you today.  *If you need a refill on your cardiac medications before your next appointment, please call your pharmacy*  Follow-Up: At [facility], you and your health needs are our priority.  As part of our continuing mission to provide you with exceptional heart care, we have created designated Provider Care Teams.  These Care Teams include your primary Cardiologist (physician) and Advanced Practice Providers (APPs -  Physician Assistants and Nurse Practitioners) who all work together to provide you with the care you need, when you need it.  We recommend signing up for the patient portal  called "MyChart".  Sign up information is provided on this After Visit Summary.  MyChart is used to connect with patients for Virtual Visits (Telemedicine).  Patients are able to view lab/test results, encounter notes, upcoming appointments, etc.  Non-urgent messages can be sent to your provider as well.   To learn more about what you can do with MyChart, go to [email or web address].    Your next appointment:   12 month(s)  The format for your next appointment:   In Person  Provider:   Oswaldo Milian, MD        Ardell Isaacs as a scribe for Donato Heinz, MD.,have documented all relevant documentation on the behalf of Donato Heinz, MD,as directed by  Donato Heinz, MD while in the presence of Donato Heinz, MD.  I, Donato Heinz, MD, have reviewed all documentation for this visit. The documentation on 04/08/21 for the exam, diagnosis, procedures, and orders are all accurate and complete.   Signed, Donato Heinz, MD  04/08/2021 2:04 PM    [place]

## 2021-04-02 NOTE — Patient Instructions (Signed)

## 2021-04-11 NOTE — Progress Notes (Signed)
SURVIVORSHIP VISIT:   REASON FOR VISIT:  Routine follow-up for history of breast cancer.    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 Garciagarcia presents to the [facility] today for routine follow-up for her history of breast cancer.   Due to her increased breast density she underwent breast MRI on 09/18/2020 that showed no evidence of malignancy, she then underwent bilateral breast mammogram on 03/20/2021 and showed no evidence of malignancy and breast density D.    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  She follows with Dr. Philip Aspen about this.  We reviewed her health maintenance section of the chart.  She is unsure when and/or if she has received pneumonia vaccine, and unclear about the date of her recent tetanus.  She has not yet received the shingles vaccine.    She and I updated her recent colonoscopy date which was in 2016 with a  10 year repeat recommended.   She notes she had covid previously and had some concerns about her heart and ? Chest pain she now thinks was anxiety.  She saw cardiology and underwent different testing that determined that her heart was doing well.    Mose is exercising regularly, and sees her PCP regularly.  Overall she is feeling quite well.     REVIEW OF SYSTEMS:  Review of Systems  Constitutional:  Negative for appetite change, chills, fatigue, fever and unexpected weight change.  HENT:   Negative  for hearing loss, lump/mass, sore throat and trouble swallowing.   Eyes:  Negative for eye problems and icterus.  Respiratory:  Negative for chest tightness, cough and shortness of breath.   Cardiovascular:  Negative for chest pain, leg swelling and palpitations.  Gastrointestinal:  Negative for abdominal distention, abdominal pain, constipation, diarrhea, nausea and vomiting.  Endocrine: Negative for hot flashes.  Musculoskeletal:  Negative for arthralgias.  Skin:  Negative for itching and rash.  Neurological:  Negative for dizziness, extremity weakness, headaches and numbness.  Hematological:  Negative for adenopathy. Does not bruise/bleed easily.  Psychiatric/Behavioral:  Negative for depression. The patient is not nervous/anxious.  Breast: Denies any new nodularity, masses, tenderness, nipple changes, or nipple discharge.    PAST MEDICAL/SURGICAL HISTORY:  Past Medical History:  Diagnosis Date   Breast cancer, stage 0    Cancer ([place]) 1996   skin cancer on eye lid   Cervical polyp Feb. 2011   benign   Ectopic pregnancy    Fibroid uterus 2007   Hyperlipidemia    Migraine    1 every 6 weeks. food triggered/hormonal   Past Surgical History:  Procedure Laterality Date   ABDOMINAL HYSTERECTOMY Bilateral 02/04/2014   Procedure: TOTAL ABDOMINAL HYSTERECTOMY, BILATERAL SALPINGO OOPHORECTOMY ;  Surgeon: [name], MD;  Location: [facility];  Service: Gynecology;  Laterality: Bilateral;   BREAST LUMPECTOMY     right breast lumpectomy, snbx   CERVICAL POLYPECTOMY  feb. 2011   DILATION AND CURETTAGE OF  UTERUS     several d &c - polyps and bleeding   ECTOPIC PREGNANCY SURGERY  August 1997   Laparoscopy for ectopic   HYSTEROSCOPY WITH D & C N/A 11/05/2013   Procedure: DILATATION AND CURETTAGE ;  Surgeon: [name], MD;  Location: [facility];  Service: Gynecology;  Laterality: N/A;   with Ultrasound guidance   SKIN CANCER EXCISION  1996   removal of small spot on eyelid,  noninvasive (basal cell) skin cancer   TONSILLECTOMY     WISDOM TOOTH EXTRACTION       ALLERGIES:  No Known Allergies   CURRENT MEDICATIONS:  Outpatient Encounter Medications as of 04/12/2021  Medication Sig Note   atorvastatin (LIPITOR) 80 MG tablet Take 0.5 tablets by mouth daily.    Cholecalciferol (VITAMIN D3) 25 MCG (1000 UT) CAPS Take 2,000 Units by mouth daily. Pt takes 2,000 units daily    CRANBERRY PO Take by mouth daily.    meclizine (ANTIVERT) 12.5 MG tablet Take 1 tablet (12.5 mg total) by mouth 3 (three) times daily as needed for dizziness.    Multiple Vitamins-Minerals (CENTRUM SILVER PO) Take 1 tablet by mouth daily.      SUMAtriptan (IMITREX) 100 MG tablet Take 100 mg by mouth as needed for migraine.     [DISCONTINUED] atorvastatin (LIPITOR) 40 MG tablet Take 1 tablet (40 mg total) by mouth daily.    [DISCONTINUED] Vitamin D, Ergocalciferol, (DRISDOL) 50000 UNITS CAPS capsule Take 50,000 Units by mouth every 7 (seven) days.  12/12/2014: Received from: External Pharmacy   No facility-administered encounter medications on file as of 04/12/2021.     ONCOLOGIC FAMILY HISTORY:  Family History  Problem Relation Age of Onset   Heart disease Father    Early death Cousin     GENETIC COUNSELING/TESTING: Not at this time  SOCIAL HISTORY:  Social History   Socioeconomic History   Marital status: Married    Spouse name: Not on file   Number of children: Not on file   Years of education: Not on file   Highest education level: Not on file  Occupational History   Not on file  Tobacco Use   Smoking status: Never   Smokeless tobacco: Never  Substance and Sexual Activity   Alcohol use: No    Alcohol/week: 0.0 standard drinks   Drug use: No   Sexual activity: Yes    Partners: Male    Birth control/protection: Post-menopausal  Other Topics Concern   Not on file  Social History Narrative   Not on file   Social Determinants of Health   Financial Resource Strain: Not  on file  Food Insecurity: Not on file  Transportation Needs: Not on file  Physical Activity: Not on file  Stress: Not on file  Social Connections: Not on file  Intimate Partner Violence: Not on file      OBJECTIVE:  BP 108/82 (BP Location: Left Arm, Patient Position: Sitting)   Pulse 75   Temp 97.9 F (36.6 C) (Temporal)   Resp 17   Ht '5\' 2"'$  (1.575 m)   Wt 114 lb 9.6 oz (52 kg)   SpO2 99%   BMI 20.96 kg/m  GENERAL: Patient is a well appearing female in no acute distress HEENT:  Sclerae anicteric.  Mask in place. Neck is supple.  NODES:  No cervical, supraclavicular, or axillary lymphadenopathy palpated.  BREAST EXAM: right breast s/p lumpectomy and radiation, no sign of local recurrence, left breast benign LUNGS:  Clear to auscultation  bilaterally.  No wheezes or rhonchi. HEART:  Regular rate and rhythm. No murmur appreciated. ABDOMEN:  Soft, nontender.  Positive, normoactive bowel sounds. No organomegaly palpated. MSK:  No focal spinal tenderness to palpation. Full range of motion bilaterally in the upper extremities. EXTREMITIES:  No peripheral edema.   SKIN:  Clear with no obvious rashes or skin changes. No nail dyscrasia. NEURO:  Nonfocal. Well oriented.  Appropriate affect.    LABORATORY DATA:  None for this visit   DIAGNOSTIC IMAGING:  Most recent mammogram: completed 03/20/2021 and neg for malignancy, breast density D.   ASSESSMENT AND PLAN:  Ms.. Crider is a pleasant 65 y.o. 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  [facility] and routine follow-up.   1. History of breast cancer:  Ms. Buscemi is currently clinically and radiographically without evidence of disease or recurrence of breast cancer. She will be due for mammogram in one week and those  orders are placed.  She has breast density category D.  I reviewed with her what that means (see #2).  She will return in 13 months for continued surveillance and monitoring.  I encouraged her to call me with any questions or concerns before her next visit at the cancer center, and I would be happy to see her sooner, if needed.    2. Breast Density D: We again reviewed her breast density and supplemental MRI to evaluate her breasts fully.  She underwent the breast MRI in 09/2020 and would like to receive another one in 09/2021 so long as she has breast density category D.  She understands the risk of false positives, and would like the full MRI, since insurance will cover it for her breast density.   I placed orders for her to undergo repeat breast MRI in 09/2021.    3. Bone health:  Given Ms. Budlong's age, history of breast cancer, she is at risk for bone demineralization. Her last bone density was in 2021 and a T score of -3.3 in her left femur.  She is taking vitamin d, calcium, and is very active with weight bearing exercises.  She will continue this.  Her PCP is following her bone density testing.  She was given education on specific food and activities to promote bone health.  4. Cancer screening:  Due to Ms. Yokum's history and her age, she should receive screening for skin cancers, colon cancer, and gynecologic cancers. She was encouraged to follow-up with her PCP for appropriate cancer screenings.   5. Health maintenance and wellness promotion: Ms. Etten was encouraged to consume 5-7 servings of fruits and vegetables per day. She was also encouraged to engage in moderate to vigorous exercise for 30 minutes per day most days of the week. She was instructed to limit her alcohol consumption and continue to abstain from tobacco use.  My nurse is reaching out to Dr. Philip Aspen to get her recent TDAP/TD vaccination status, and PNA vaccination status.  I let Zoà know I would call her if either one of  those were due.      Follow up instructions:    -Return to cancer center in 1 year for LTS follow up -Mammogram due in 03/2022 -Breast MRI 09/2021  She knows to call for any questions that may arise between now and her next appointment.  We  are happy to see her sooner if needed.  Total encounter time: 30 minutes in chart review, order entry, care coordination, face to face visit time, and documentation of the encounter.  Wilber Bihari, NP 04/12/21 12:12 PM Medical Oncology and Hematology [facility] [address], [place] [address] Tel. [phone]    Fax. [phone]   *Total Encounter Time as defined by the Centers for Medicare and Medicaid Services includes, in addition to the face-to-face time of a patient visit (documented in the note above) non-face-to-face time: obtaining and reviewing outside history, ordering and reviewing medications, tests or procedures, care coordination (communications with other health care professionals or caregivers) and documentation in the medical record.  Note: PRIMARY CARE PROVIDER Leanna Battles, [place] [phone]

## 2021-04-12 ENCOUNTER — Other Ambulatory Visit: Payer: Self-pay

## 2021-04-12 ENCOUNTER — Inpatient Hospital Stay: Payer: Medicare Other | Attending: Adult Health | Admitting: Adult Health

## 2021-04-12 VITALS — BP 108/82 | HR 75 | Temp 97.9°F | Resp 17 | Ht 62.0 in | Wt 114.6 lb

## 2021-04-12 DIAGNOSIS — Z853 Personal history of malignant neoplasm of breast: Secondary | ICD-10-CM | POA: Insufficient documentation

## 2021-04-12 DIAGNOSIS — D0591 Unspecified type of carcinoma in situ of right breast: Secondary | ICD-10-CM | POA: Diagnosis not present

## 2021-04-12 DIAGNOSIS — Z8249 Family history of ischemic heart disease and other diseases of the circulatory system: Secondary | ICD-10-CM | POA: Insufficient documentation

## 2021-04-12 DIAGNOSIS — Z923 Personal history of irradiation: Secondary | ICD-10-CM | POA: Diagnosis not present

## 2021-04-12 DIAGNOSIS — Z90722 Acquired absence of ovaries, bilateral: Secondary | ICD-10-CM | POA: Insufficient documentation

## 2021-04-12 DIAGNOSIS — Z8759 Personal history of other complications of pregnancy, childbirth and the puerperium: Secondary | ICD-10-CM | POA: Diagnosis not present

## 2021-04-12 DIAGNOSIS — R922 Inconclusive mammogram: Secondary | ICD-10-CM | POA: Diagnosis not present

## 2021-04-12 DIAGNOSIS — Z13820 Encounter for screening for osteoporosis: Secondary | ICD-10-CM | POA: Diagnosis not present

## 2021-04-12 DIAGNOSIS — Z79899 Other long term (current) drug therapy: Secondary | ICD-10-CM | POA: Insufficient documentation

## 2021-06-19 ENCOUNTER — Ambulatory Visit (INDEPENDENT_AMBULATORY_CARE_PROVIDER_SITE_OTHER): Payer: Medicare Other | Admitting: Dermatology

## 2021-06-19 ENCOUNTER — Other Ambulatory Visit: Payer: Self-pay

## 2021-06-19 ENCOUNTER — Encounter: Payer: Self-pay | Admitting: Dermatology

## 2021-06-19 DIAGNOSIS — L720 Epidermal cyst: Secondary | ICD-10-CM

## 2021-06-19 DIAGNOSIS — L821 Other seborrheic keratosis: Secondary | ICD-10-CM | POA: Diagnosis not present

## 2021-06-19 DIAGNOSIS — D1801 Hemangioma of skin and subcutaneous tissue: Secondary | ICD-10-CM

## 2021-06-19 DIAGNOSIS — Z1283 Encounter for screening for malignant neoplasm of skin: Secondary | ICD-10-CM

## 2021-06-19 DIAGNOSIS — I2510 Atherosclerotic heart disease of native coronary artery without angina pectoris: Secondary | ICD-10-CM | POA: Diagnosis not present

## 2021-06-19 NOTE — Patient Instructions (Signed)
left inner eye = milia  Back, legs, left & right arm = keratoses Left chest = angioma  ALL SAFE TO LEAVE

## 2021-07-06 ENCOUNTER — Encounter: Payer: Self-pay | Admitting: Dermatology

## 2021-07-06 NOTE — Progress Notes (Signed)
   Follow-Up Visit   Subjective  Connie Miller is a 65 y.o. female who presents for the following: Annual Exam (Skin check left inner eye & right lower legs both lesions x months and just persistent not bleeding or tender to touch. No peraonal h/o  of skin cancer or atypia ).  General skin check, several spots that she has noted Location:  Duration:  Quality:  Associated Signs/Symptoms: Modifying Factors:  Severity:  Timing: Context:   Objective  Well appearing patient in no apparent distress; mood and affect are within normal limits. No atypical pigmented lesions or nonmelanoma skin cancer.  Torso - Posterior (Back) Back, lower legs, arms: Flattopped 3 to 6 mm brown textured papules  Left Medial Canthus Left inner eye half millimeter firm white upper dermal micropapule  Left Breast Full body skin check no atypia or skin cancer found today.  Smooth red 2 mm dermal papule on chest    A full examination was performed including scalp, head, eyes, ears, nose, lips, neck, chest, axillae, abdomen, back, buttocks, bilateral upper extremities, bilateral lower extremities, hands, feet, fingers, toes, fingernails, and toenails. All findings within normal limits unless otherwise noted below.  Areas beneath undergarments not fully examined.   Assessment & Plan    Seborrheic keratosis Torso - Posterior (Back)  Safe to leave if stable; one on the right upper back seems a little warty and she may choose to have this removed if there is change.  Milia Left Medial Canthus  No intervention initiated  Cherry angioma Left Breast  No intervention initiated  Encounter for screening for malignant neoplasm of skin  Annual skin examination, encouraged to self examine twice annually.      I, Lavonna Monarch, MD, have reviewed all documentation for this visit.  The documentation on 07/06/21 for the exam, diagnosis, procedures, and orders are all accurate and complete.

## 2021-08-07 ENCOUNTER — Telehealth: Payer: Self-pay

## 2021-08-07 NOTE — Telephone Encounter (Signed)
Sent to front staff

## 2021-10-10 ENCOUNTER — Encounter: Payer: Self-pay | Admitting: Adult Health

## 2021-10-10 ENCOUNTER — Telehealth: Payer: Self-pay

## 2021-10-10 NOTE — Telephone Encounter (Signed)
-----   Message from Lynnae Sandhoff, LPN sent at 04/11/6724 11:36 AM EST ----- Regarding: MRI PA Hi team,  Looks like this pt was supposed to have an MRI 09/18/21. Authorization has not been started. Do we need PA to get this scheduled? Please let us know ASAP.  Fulton Mole, LPN

## 2021-10-10 NOTE — Telephone Encounter (Signed)
Received message that pt's MRI had been approved by insurance. Pt was provided with central scheduling phone number to call and schedule. She verbalized thanks and understanding.

## 2021-11-19 ENCOUNTER — Ambulatory Visit (HOSPITAL_COMMUNITY)
Admission: RE | Admit: 2021-11-19 | Discharge: 2021-11-19 | Disposition: A | Discharge status: Home / Self Care | Payer: Medicare Other | Source: Ambulatory Visit | Attending: Adult Health | Admitting: Adult Health

## 2021-11-19 ENCOUNTER — Other Ambulatory Visit: Payer: Self-pay

## 2021-11-19 DIAGNOSIS — R922 Inconclusive mammogram: Secondary | ICD-10-CM | POA: Insufficient documentation

## 2021-11-19 DIAGNOSIS — D0591 Unspecified type of carcinoma in situ of right breast: Secondary | ICD-10-CM | POA: Diagnosis not present

## 2021-11-19 MED ORDER — GADOBUTROL 1 MMOL/ML IV SOLN
5.0000 mL | Freq: Once | INTRAVENOUS | Status: AC | PRN
  Administered 2021-11-19: 5 mL via INTRAVENOUS

## 2021-11-20 ENCOUNTER — Telehealth: Payer: Self-pay

## 2021-11-20 NOTE — Telephone Encounter (Signed)
-----   Message from Gardenia Phlegm, NP sent at 11/19/2021  4:46 PM EDT ----- ?Regarding: FW: ?Please let patient know no mri evidence of malignancy ?----- Message ----- ?From: Interface, Rad Results In ?Sent: 11/19/2021   3:38 PM EDT ?To: Gardenia Phlegm, NP ? ?

## 2021-11-20 NOTE — Telephone Encounter (Signed)
Called and left VM for pt, per [facility], no evidence of malignancy from MR Breast.  Requested pt return call if she has further questions.   ?

## 2022-03-05 ENCOUNTER — Telehealth: Payer: Self-pay | Admitting: Adult Health

## 2022-03-27 ENCOUNTER — Encounter: Payer: Self-pay | Admitting: Dermatology

## 2022-03-27 ENCOUNTER — Ambulatory Visit (INDEPENDENT_AMBULATORY_CARE_PROVIDER_SITE_OTHER): Payer: Medicare Other | Admitting: Dermatology

## 2022-03-27 DIAGNOSIS — C44319 Basal cell carcinoma of skin of other parts of face: Secondary | ICD-10-CM | POA: Diagnosis not present

## 2022-03-27 DIAGNOSIS — D485 Neoplasm of uncertain behavior of skin: Secondary | ICD-10-CM

## 2022-03-27 NOTE — Patient Instructions (Signed)

## 2022-03-29 ENCOUNTER — Encounter: Payer: Self-pay | Admitting: Dermatology

## 2022-03-30 ENCOUNTER — Encounter: Payer: Self-pay | Admitting: Dermatology

## 2022-04-01 NOTE — Telephone Encounter (Signed)
Phone call to patient with her pathology results and to inform her that we will be sending her to [facility] for the [facility] procedure.

## 2022-04-15 ENCOUNTER — Encounter: Payer: Self-pay | Admitting: Adult Health

## 2022-04-15 ENCOUNTER — Other Ambulatory Visit: Payer: Self-pay

## 2022-04-15 ENCOUNTER — Encounter: Payer: Medicare Other | Admitting: Adult Health

## 2022-04-15 ENCOUNTER — Inpatient Hospital Stay: Payer: Medicare Other | Attending: Adult Health | Admitting: Adult Health

## 2022-04-15 VITALS — BP 139/86 | HR 83 | Temp 97.9°F | Resp 16 | Ht 62.0 in | Wt 111.0 lb

## 2022-04-15 DIAGNOSIS — R922 Inconclusive mammogram: Secondary | ICD-10-CM | POA: Diagnosis not present

## 2022-04-15 DIAGNOSIS — D0591 Unspecified type of carcinoma in situ of right breast: Secondary | ICD-10-CM | POA: Diagnosis not present

## 2022-04-15 DIAGNOSIS — Z79899 Other long term (current) drug therapy: Secondary | ICD-10-CM | POA: Insufficient documentation

## 2022-04-15 DIAGNOSIS — Z8249 Family history of ischemic heart disease and other diseases of the circulatory system: Secondary | ICD-10-CM | POA: Insufficient documentation

## 2022-04-15 DIAGNOSIS — D0511 Intraductal carcinoma in situ of right breast: Secondary | ICD-10-CM | POA: Diagnosis present

## 2022-04-15 DIAGNOSIS — Z8759 Personal history of other complications of pregnancy, childbirth and the puerperium: Secondary | ICD-10-CM | POA: Diagnosis not present

## 2022-04-15 DIAGNOSIS — N6091 Unspecified benign mammary dysplasia of right breast: Secondary | ICD-10-CM | POA: Diagnosis not present

## 2022-04-15 NOTE — Assessment & Plan Note (Signed)
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     Connie Miller has no clinical or radiographic sign of breast cancer recurrence.  We discussed her breast density and she would like to continue with annual breast mri with mammogram 6 months after.    Health maintenance/wellness promotion: She was recommended to continue with healthy diet and exercise.  She will continue with her pcp f/u as well.  We will see Connie Miller back at her request in one year or sooner if needed.

## 2022-04-15 NOTE — Progress Notes (Signed)
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 female returns for follow-up of her history of right breast Miller.  Because of her breast density category D, she undergoes annual breast MRI alternating with mammogram.  Her most recent breast MRI was completed on November 19, 2021 showing no evidence of malignancy and continued breast density category D.  She tells me that she is doing moderately well.  She has no significant health changes and sees her primary care provider annually.   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  MEDICAL HISTORY: Past Medical History:  Diagnosis Date   Basal cell carcinoma    per patient eye bcc at age 20   Breast Miller, stage 0    Miller ([place]) 09/09/1994   skin Miller on eye lid   Cervical polyp 10/10/2009   benign   Ectopic pregnancy    Fibroid uterus 09/09/2005   Hyperlipidemia    Migraine    1 every 6 weeks. food triggered/hormonal    SURGICAL HISTORY: Past Surgical History:  Procedure Laterality Date   ABDOMINAL  HYSTERECTOMY Bilateral 02/04/2014   Procedure: TOTAL ABDOMINAL HYSTERECTOMY, BILATERAL SALPINGO OOPHORECTOMY ;  Surgeon: [name], MD;  Location: [facility];  Service: Gynecology;  Laterality: Bilateral;   BREAST LUMPECTOMY     right breast lumpectomy, snbx   CERVICAL POLYPECTOMY  feb. 2011   DILATION AND CURETTAGE OF UTERUS     several d &c - polyps and bleeding   ECTOPIC PREGNANCY SURGERY  August 1997   Laparoscopy for ectopic   HYSTEROSCOPY WITH D & C N/A 11/05/2013   Procedure: DILATATION AND CURETTAGE ;  Surgeon: [name], MD;  Location: [facility];  Service: Gynecology;  Laterality: N/A;   with Ultrasound guidance   SKIN Miller EXCISION  1996   removal of small spot on eyelid, noninvasive (basal cell) skin Miller   TONSILLECTOMY     WISDOM TOOTH EXTRACTION      SOCIAL HISTORY: Social History   Socioeconomic History   Marital status: Married    Spouse name: Not on file   Number of children: Not on file   Years of education: Not on file   Highest education level: Not on file  Occupational History   Not on file  Tobacco Use   Smoking status: Never   Smokeless tobacco: Never  Vaping Use   Vaping Use: Never used  Substance and Sexual Activity   Alcohol use: No    Alcohol/week: 0.0 standard drinks of alcohol   Drug use: No   Sexual activity: Yes  Partners: Male    Birth control/protection: Post-menopausal  Other Topics Concern   Not on file  Social History Narrative   Not on file   Social Determinants of Health   Financial Resource Strain: Not on file  Food Insecurity: Not on file  Transportation Needs: Not on file  Physical Activity: Not on file  Stress: Not on file  Social Connections: Not on file  Intimate Partner Violence: Not on file    FAMILY HISTORY: Family History  Problem Relation Age of Onset   Heart disease Father    Early death Cousin     Review of Systems  Constitutional:  Negative for appetite change, chills, fatigue, fever and  unexpected weight change.  HENT:   Negative for hearing loss, lump/mass and trouble swallowing.   Eyes:  Negative for eye problems and icterus.  Respiratory:  Negative for chest tightness, cough and shortness of breath.   Cardiovascular:  Negative for chest pain, leg swelling and palpitations.  Gastrointestinal:  Negative for abdominal distention, abdominal pain, constipation, diarrhea, nausea and vomiting.  Endocrine: Negative for hot flashes.  Genitourinary:  Negative for difficulty urinating.   Musculoskeletal:  Negative for arthralgias.  Skin:  Negative for itching and rash.  Neurological:  Negative for dizziness, extremity weakness, headaches and numbness.  Hematological:  Negative for adenopathy. Does not bruise/bleed easily.  Psychiatric/Behavioral:  Negative for depression. The patient is not nervous/anxious.       PHYSICAL EXAMINATION  ECOG PERFORMANCE STATUS: 0 - Asymptomatic  Vitals:   04/15/22 0913  BP: 139/86  Pulse: 83  Resp: 16  Temp: 97.9 F (36.6 C)  SpO2: 100%    Physical Exam Constitutional:      General: She is not in acute distress.    Appearance: Normal appearance. She is not toxic-appearing.  HENT:     Head: Normocephalic and atraumatic.  Eyes:     General: No scleral icterus. Cardiovascular:     Rate and Rhythm: Normal rate and regular rhythm.     Pulses: Normal pulses.     Heart sounds: Normal heart sounds.  Pulmonary:     Effort: Pulmonary effort is normal.     Breath sounds: Normal breath sounds.  Chest:     Comments: Right breast status postlumpectomy and radiation no sign of local recurrence left breast is benign. Abdominal:     General: Abdomen is flat. Bowel sounds are normal. There is no distension.     Palpations: Abdomen is soft.     Tenderness: There is no abdominal tenderness.  Musculoskeletal:        General: No swelling.     Cervical back: Neck supple.  Lymphadenopathy:     Cervical: No cervical adenopathy.  Skin:     General: Skin is warm and dry.     Findings: No rash.  Neurological:     General: No focal deficit present.     Mental Status: She is alert.  Psychiatric:        Mood and Affect: Mood normal.        Behavior: Behavior normal.     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     Connie Miller has no clinical or radiographic sign of breast Miller recurrence.  We discussed her breast density and she would like to continue with annual breast mri with mammogram 6 months after.    Health maintenance/wellness promotion: She was recommended to continue with healthy diet and exercise.  She will continue with her pcp f/u as well.  We will see Connie Miller back at her request in one year or sooner if needed.  All questions were answered. The patient knows to call the clinic with any problems, questions or concerns. We can certainly see the patient much sooner if necessary.  Total encounter time:20 minutes*in face-to-face visit time, chart review, lab review, care coordination, order entry, and documentation of the encounter time.    Wilber Bihari, NP 04/15/22 11:25 AM Medical Oncology and Hematology [facility] [place], [place] [address] Tel. [phone]    Fax. [phone]  *Total Encounter Time as defined by the Centers for Medicare and Medicaid Services includes, in addition to the face-to-face time of a patient visit (documented in the note above) non-face-to-face time: obtaining and reviewing outside history, ordering and reviewing medications, tests or procedures, care coordination (communications with other health care professionals or caregivers) and documentation in the medical record.

## 2022-04-17 ENCOUNTER — Encounter: Payer: Self-pay | Admitting: Dermatology

## 2022-04-17 NOTE — Progress Notes (Signed)
   Follow-Up Visit   Subjective  Connie Miller is a 66 y.o. female who presents for the following: Follow-up (Right temple lesion was present at last visit she just wants to make sure its ok to leave. She has a yearly check on October ).  Check spot on right temple Location:  Duration:  Quality:  Associated Signs/Symptoms: Modifying Factors:  Severity:  Timing: Context:   Objective  Well appearing patient in no apparent distress; mood and affect are within normal limits. Right Temple Pearly flesh-colored 4 mm papule with 3 tiny specks of gray pigment, dermoscopy favors pigmented BCC         A focused examination was performed including head and neck. Relevant physical exam findings are noted in the Assessment and Plan.   Assessment & Plan    Neoplasm of uncertain behavior of skin Right Temple  Skin / nail biopsy Type of biopsy: tangential   Informed consent: discussed and consent obtained   Timeout: patient name, date of birth, surgical site, and procedure verified   Anesthesia: the lesion was anesthetized in a standard fashion   Anesthetic:  1% lidocaine w/ epinephrine 1-100,000 local infiltration Instrument used: flexible razor blade   Hemostasis achieved with: ferric subsulfate and electrodesiccation   Outcome: patient tolerated procedure well   Post-procedure details: sterile dressing applied and wound care instructions given   Dressing type: bandage and petrolatum    Specimen 1 - Surgical pathology Differential Diagnosis: R/O ISK vs Atypia - cautery after biopsy  Check Margins: No      I, Lavonna Monarch, MD, have reviewed all documentation for this visit.  The documentation on 04/17/22 for the exam, diagnosis, procedures, and orders are all accurate and complete.

## 2022-04-21 IMAGING — MR MR BREAST BILAT WO/W CM
9 of 12 series · 30 of 48 positions shown · IV contrast (5 ML GDAVIST)
Comparison: MRI on 09/18/2020 and screening mammogram on 03/20/2021
from [REDACTED]

CLINICAL DATA: Breast cancer screening. Personal history of RIGHT
breast cancer in 1881. The patient was treated with lumpectomy and
radiation therapy.

EXAM:
BILATERAL BREAST MRI WITH AND WITHOUT CONTRAST
TECHNIQUE: Multiplanar, multisequence MR images of both breasts were obtained
prior to and following the intravenous administration of 5 ml of
Gadavist

[Series 2: T2 · axial · 3.0mm · 0.91mm/px · 1 of 41 slices shown]
[im 1/41]
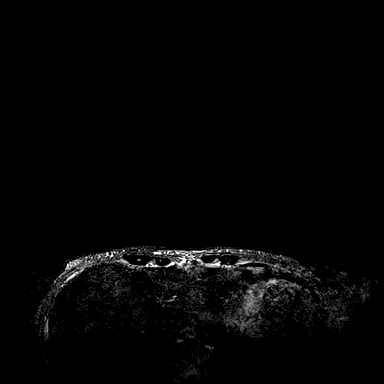

[Series 3: T1 fat-sat · axial · 1.2mm · 0.78mm/px · z∈[-87,+85]mm · 5 of 139 slices shown (1 of 5)]
[im 1/139]
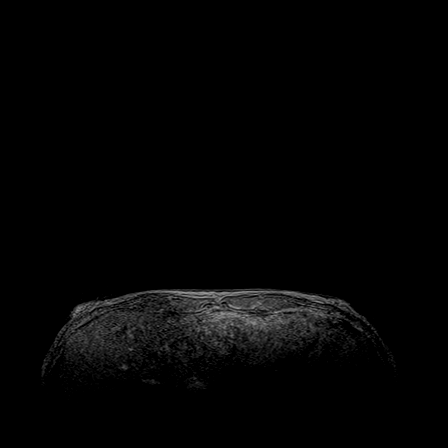
[im 35/139]
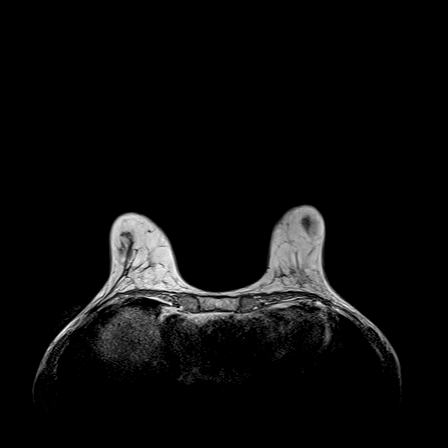
[im 70/139]
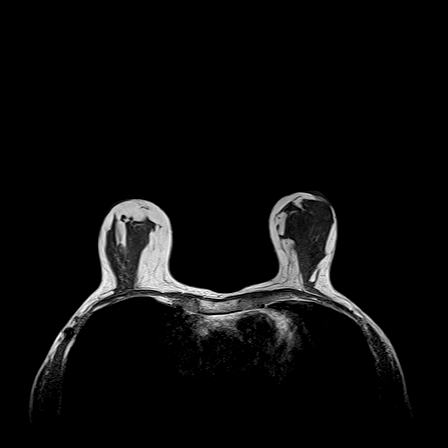
[im 104/139]
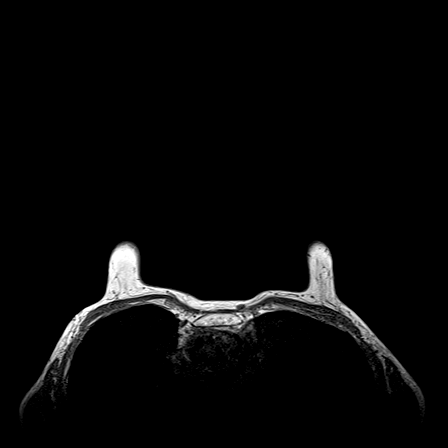
[im 139/139]
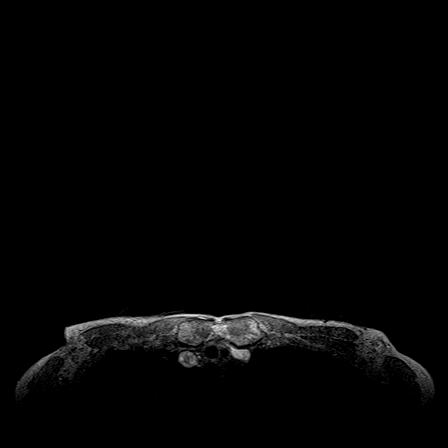

[Series 4: T1 fat-sat · axial · 1.2mm · 0.84mm/px · z∈[-68,+65]mm · 5 of 112 slices shown (2 of 5)]
[im 1/112]
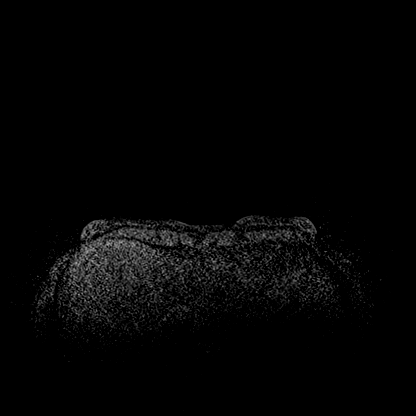
[im 28/112]
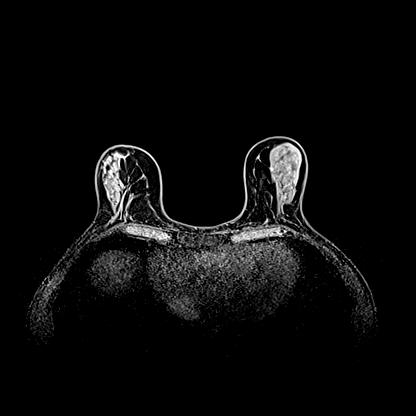
[im 56/112]
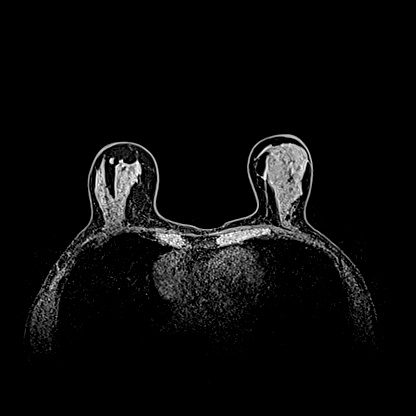
[im 84/112]
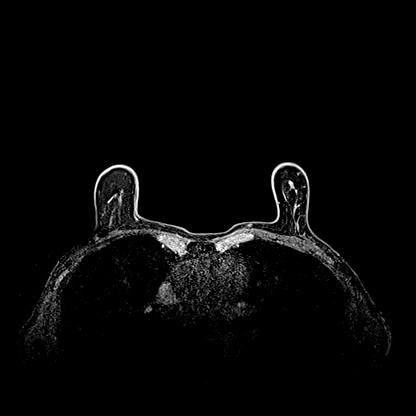
[im 112/112]
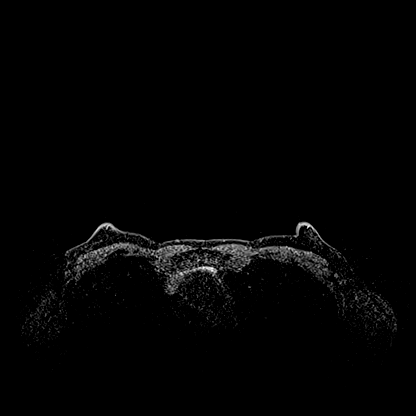

[Series 5: T1 fat-sat · axial · 1.2mm · 0.84mm/px · z∈[-68,+65]mm · 5 of 112 slices shown (3 of 5)]
[im 1/112]
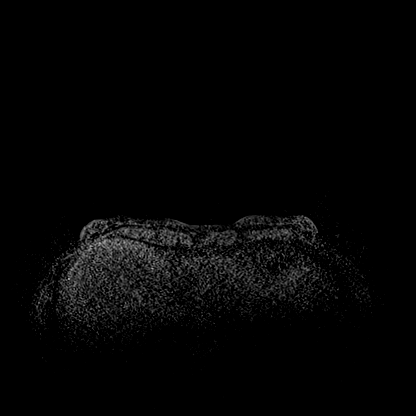
[im 28/112]
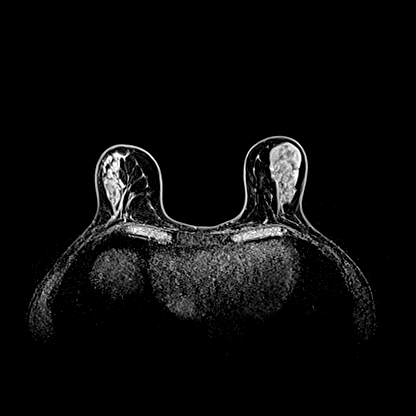
[im 56/112]
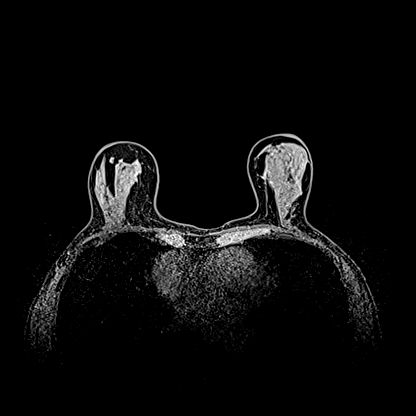
[im 84/112]
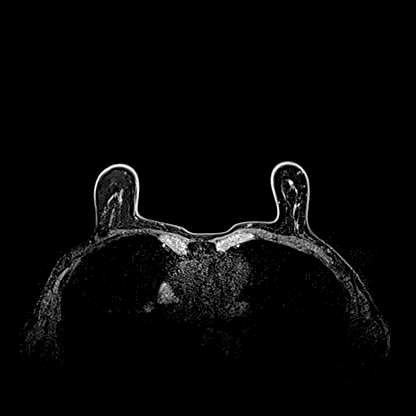
[im 112/112]
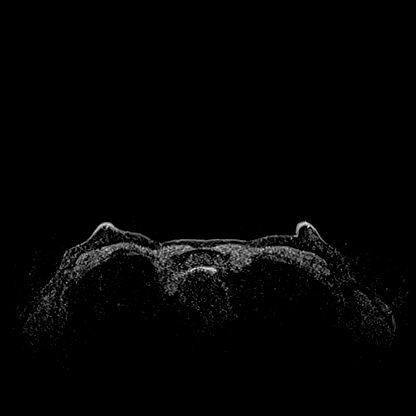

[Series 6: T1 fat-sat · axial · 1.2mm · 0.84mm/px · z∈[-68,+65]mm · 5 of 112 slices shown (4 of 5)]
[im 1/112]
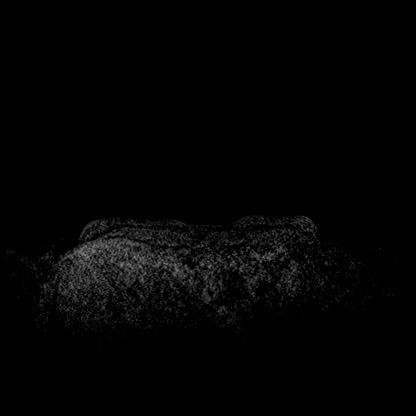
[im 28/112]
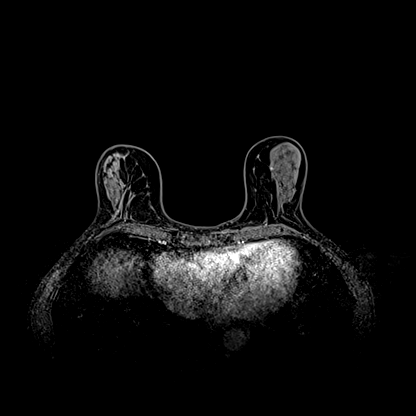
[im 56/112]
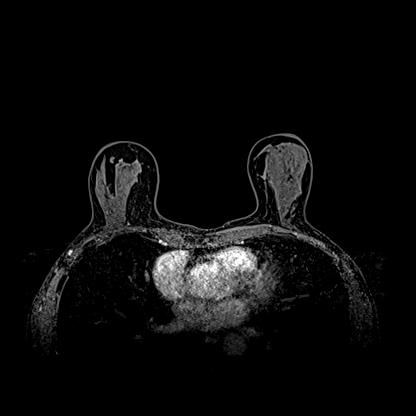
[im 84/112]
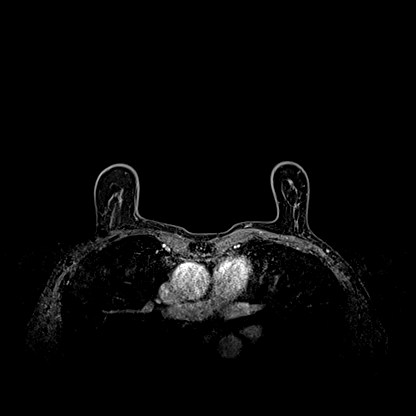
[im 112/112]
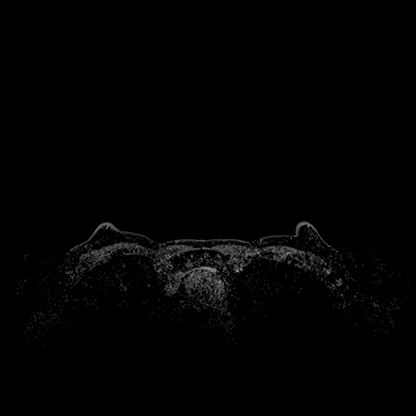

[Series 7: T1 · axial · 1.2mm · 0.84mm/px · z∈[-68,+65]mm · 5 of 112 slices shown (1 of 3)]
[im 1/112]
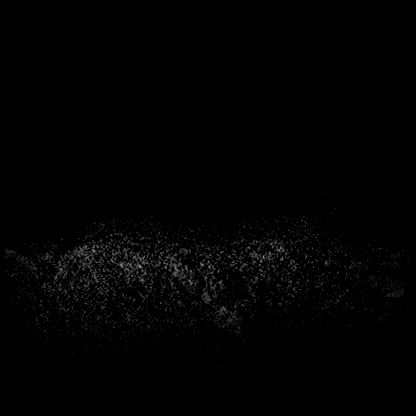
[im 28/112]
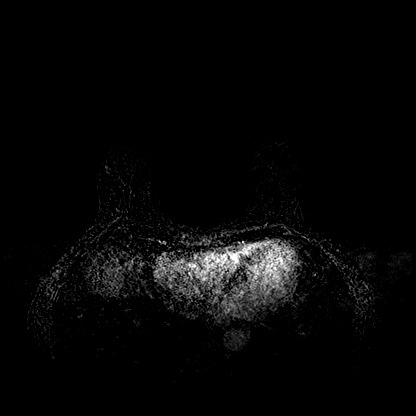
[im 56/112]
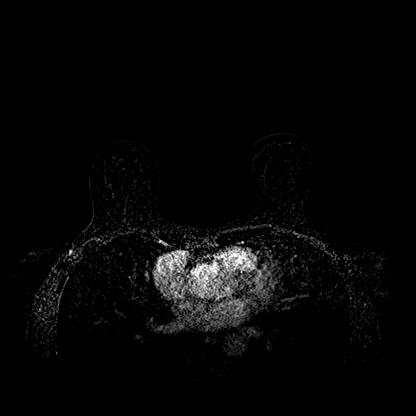
[im 84/112]
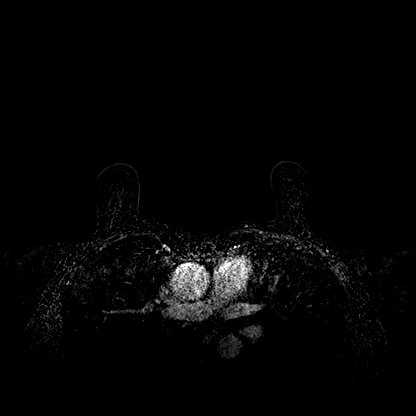
[im 112/112]
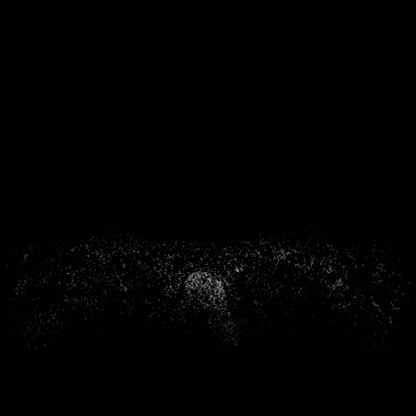

[Series 8: T1 · coronal · 350.0mm · 0.84mm/px · 1 of 3 slices shown (2 of 3)]
[im 1/3]
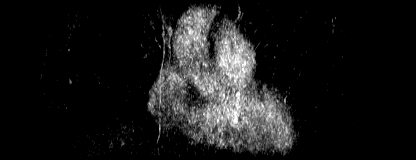

[Series 9: T1 · axial · 134.4mm · 0.84mm/px · 1 of 3 slices shown (3 of 3)]
[im 1/3]
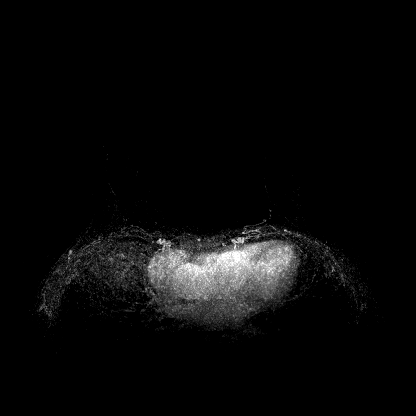

[Series 10: T1 fat-sat · axial · 1.2mm · 0.84mm/px · z∈[-68,-35]mm · 2 of 112 slices shown (5 of 5)]
[im 1/112]
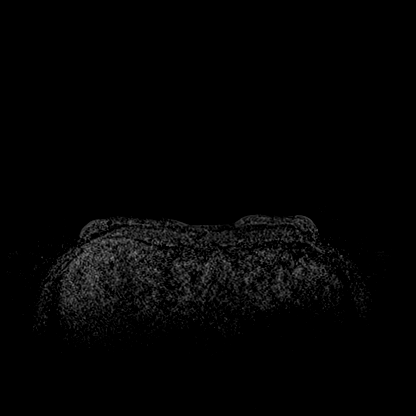
[im 28/112]
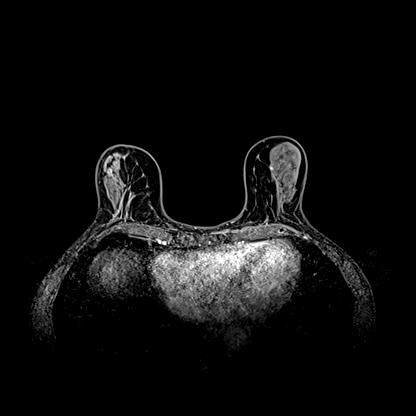

[30 of 48 positions shown; findings below may reference images not displayed]

Three-dimensional MR images were rendered by post-processing of the
original MR data on an independent workstation. The
three-dimensional MR images were interpreted, and findings are
reported in the following complete MRI report for this study. Three
dimensional images were evaluated at the independent interpreting
workstation using the DynaCAD thin client.
FINDINGS: Breast composition: d. Extreme fibroglandular tissue.

Background parenchymal enhancement: Minimal

Right breast: No mass or abnormal enhancement. Postoperative changes
are identified in the UPPER-OUTER QUADRANT of the RIGHT breast.

Left breast: No mass or abnormal enhancement.

Lymph nodes: No abnormal appearing lymph nodes.

Ancillary findings:  None.
IMPRESSION: No MRI evidence for malignancy.

RECOMMENDATION:
Recommend screening mammogram in March 2022.

Based on the recommendations of the [employer], annual
screening MRI is suggested in addition to annual mammography if the
patient has an estimated lifetime risk of developing breast cancer
which is greater than 20%.

BI-RADS CATEGORY  2: Benign.

## 2022-05-22 ENCOUNTER — Encounter: Payer: Self-pay | Admitting: Hematology and Oncology

## 2022-06-19 ENCOUNTER — Ambulatory Visit: Payer: Medicare Other | Admitting: Dermatology

## 2022-09-16 NOTE — Progress Notes (Unsigned)
Cardiology Office Note:    Date:  09/17/2022   ID:  [name], DOB 12/15/1955, MRN [id number]  PCP:  Donnajean Lopes, MD  Cardiologist:  Donato Heinz, MD  Electrophysiologist:  None   Referring MD: Donnajean Lopes, MD   Chief Complaint  Patient presents with   Follow-up   Palpitations     History of Present Illness:    Connie Miller is a 67 y.o. female with a hx of breast cancer, skin cancer, hyperlipidemia who presents for follow-up.  She was referred by Dr. Philip Aspen for evaluation of palpitations, initially seen on 12/21/2019.  She reports that she received her second COVID-19 vaccine on March 10.  At night she had what she thought was a panic attack.  States that her heart was racing and she felt very anxious.  Episode lasted for hours.  She has had a couple more episodes of this since that time.  States that she has been walking every morning for 45 minutes.  She denies any dyspnea or chest pain.  Smoked briefly when younger.  Father had MI at age 27, died of MI at age 38.  Zio patch x14 days on 01/31/2020 showed one 8 beat episode of NSVT.  TTE on 02/28/20 showed normal biventricular function, no significant valvular disease.  Calcium score on 12/23/2019 was 125 (85th percentile).  Since last clinic visit, she reports that she has been doing well.  Had 1 episode of palpitations in the last year, reports occurred last month and lasted all day.  States that she was under a lot of stress at that time.  Reports she started exercising again and is feeling better.  She is exercising 30 to 45 minutes/day. Denies any chest pain, dyspnea, lightheadedness, syncope, lower extremity edema.  Reports vertigo symptoms have resolved.   Past Medical History:  Diagnosis Date   Basal cell carcinoma    per patient eye bcc at age 7   Breast cancer, stage 0    Cancer ([place]) 09/09/1994   skin cancer on eye lid   Cervical polyp 10/10/2009   benign   Ectopic pregnancy    Fibroid  uterus 09/09/2005   Hyperlipidemia    Migraine    1 every 6 weeks. food triggered/hormonal    Past Surgical History:  Procedure Laterality Date   ABDOMINAL HYSTERECTOMY Bilateral 02/04/2014   Procedure: TOTAL ABDOMINAL HYSTERECTOMY, BILATERAL SALPINGO OOPHORECTOMY ;  Surgeon: [name], MD;  Location: [facility];  Service: Gynecology;  Laterality: Bilateral;   BREAST LUMPECTOMY     right breast lumpectomy, snbx   CERVICAL POLYPECTOMY  feb. 2011   DILATION AND CURETTAGE OF UTERUS     several d &c - polyps and bleeding   ECTOPIC PREGNANCY SURGERY  August 1997   Laparoscopy for ectopic   HYSTEROSCOPY WITH D & C N/A 11/05/2013   Procedure: DILATATION AND CURETTAGE ;  Surgeon: [name], MD;  Location: [facility];  Service: Gynecology;  Laterality: N/A;   with Ultrasound guidance   SKIN CANCER EXCISION  1996   removal of small spot on eyelid, noninvasive (basal cell) skin cancer   TONSILLECTOMY     WISDOM TOOTH EXTRACTION      Current Medications: Current Meds  Medication Sig   atorvastatin (LIPITOR) 40 MG tablet Take 40 mg by mouth daily.   Cholecalciferol (VITAMIN D3) 25 MCG (1000 UT) CAPS Take 2,000 Units by mouth daily. Pt takes 2,000 units daily   CRANBERRY PO Take by mouth  daily.   meclizine (ANTIVERT) 12.5 MG tablet Take 1 tablet (12.5 mg total) by mouth 3 (three) times daily as needed for dizziness.   Multiple Vitamins-Minerals (CENTRUM SILVER PO) Take 1 tablet by mouth daily.     SUMAtriptan (IMITREX) 100 MG tablet Take 100 mg by mouth as needed for migraine.      Allergies:   Bupropion and Fluoxetine   Social History   Socioeconomic History   Marital status: Married    Spouse name: Not on file   Number of children: Not on file   Years of education: Not on file   Highest education level: Not on file  Occupational History   Not on file  Tobacco Use   Smoking status: Never   Smokeless tobacco: Never  Vaping Use   Vaping Use: Never used  Substance and  Sexual Activity   Alcohol use: No    Alcohol/week: 0.0 standard drinks of alcohol   Drug use: No   Sexual activity: Yes    Partners: Male    Birth control/protection: Post-menopausal  Other Topics Concern   Not on file  Social History Narrative   Not on file   Social Determinants of Health   Financial Resource Strain: Not on file  Food Insecurity: Not on file  Transportation Needs: Not on file  Physical Activity: Not on file  Stress: Not on file  Social Connections: Not on file     Family History: The patient's family history includes Early death in her cousin; Heart disease in her father.  ROS:   Please see the history of present illness.    All other systems reviewed and are negative.  EKGs/Labs/Other Studies Reviewed:    The following studies were reviewed today:   EKG:   09/17/2022: Normal sinus rhythm, rate 64, nonspecific T wave flattening 07/22: sinus rhythm, rate 68, no ST abnormalities 07/21: NSR, rate 97  Recent Labs: No results found for requested labs within last 365 days.  Recent Lipid Panel    Component Value Date/Time   CHOL 136 03/29/2020 0944   TRIG 46 03/29/2020 0944   HDL 69 03/29/2020 0944   CHOLHDL 2.0 03/29/2020 0944   LDLCALC 56 03/29/2020 0944    Physical Exam:    VS:  BP 124/72   Pulse 64   Ht '5\' 2"'$  (1.575 m)   Wt 110 lb 6.4 oz (50.1 kg)   SpO2 99%   BMI 20.19 kg/m     Wt Readings from Last 3 Encounters:  09/17/22 110 lb 6.4 oz (50.1 kg)  04/15/22 110 lb 15.4 oz (50.3 kg)  04/12/21 114 lb 9.6 oz (52 kg)     GEN:  Well nourished, well developed in no acute distress HEENT: Normal NECK: No JVD; No carotid bruits CARDIAC: RRR, no murmurs, rubs, gallops RESPIRATORY:  Clear to auscultation without rales, wheezing or rhonchi  ABDOMEN: Soft, non-tender, non-distended MUSCULOSKELETAL:  No edema; No deformity  SKIN: Warm and dry NEUROLOGIC:  Alert and oriented x 3 PSYCHIATRIC:  Normal affect   ASSESSMENT:    1. Coronary  artery disease involving native coronary artery of native heart without angina pectoris   2. Palpitations   3. Lightheadedness   4. Hyperlipidemia, unspecified hyperlipidemia type     PLAN:    CAD: Calcium score on 12/23/2019 was 125 (85th percentile).  Denies any anginal symptoms. -Continue atorvastatin 40 mg daily  Palpitations: description concerning for panic attack.  Zio patch x14 days showed no significant arrhythmia.  Did have  one 8 beat episode of NSVT.  Echocardiogram shows no structural heart disease.   -She reports palpitations have improved, now occurring rarely.  Discussed Kardia mobile device for further monitoring  Lightheadedness: Describes sensation of the room is spinning with certain head movements, suggestive of BPPV.  Prescribed as needed meclizine, but reports symptoms have improved and has not had to use  Hyperlipidemia: On atorvastatin 40 mg daily.  LDL 73 on 08/21/2021.  Calcium score on 12/23/2019 was 125 (85th percentile).  Has upcoming labs with PCP, will follow-up results.  Goal LDL less than 70  RTC in 1 year   Medication Adjustments/Labs and Tests Ordered: Current medicines are reviewed at length with the patient today.  Concerns regarding medicines are outlined above.  Orders Placed This Encounter  Procedures   EKG 12-Lead    No orders of the defined types were placed in this encounter.    Patient Instructions  Medication Instructions:  Your physician recommends that you continue on your current medications as directed. Please refer to the Current Medication list given to you today.  *If you need a refill on your cardiac medications before your next appointment, please call your pharmacy*  Follow-Up: At [facility], you and your health needs are our priority.  As part of our continuing mission to provide you with exceptional heart care, we have created designated Provider Care Teams.  These Care Teams include your primary Cardiologist  (physician) and Advanced Practice Providers (APPs -  Physician Assistants and Nurse Practitioners) who all work together to provide you with the care you need, when you need it.  We recommend signing up for the patient portal called "MyChart".  Sign up information is provided on this After Visit Summary.  MyChart is used to connect with patients for Virtual Visits (Telemedicine).  Patients are able to view lab/test results, encounter notes, upcoming appointments, etc.  Non-urgent messages can be sent to your provider as well.   To learn more about what you can do with MyChart, go to [email or web address].    Your next appointment:   12 month(s)  The format for your next appointment:   In Person  Provider:   Donato Heinz, MD     Other Instructions Recommend [facility]          Signed, Donato Heinz, MD  09/17/2022 11:02 AM    [place]

## 2022-09-17 ENCOUNTER — Ambulatory Visit: Payer: Medicare Other | Attending: Cardiology | Admitting: Cardiology

## 2022-09-17 VITALS — BP 124/72 | HR 64 | Ht 62.0 in | Wt 110.4 lb

## 2022-09-17 DIAGNOSIS — I2510 Atherosclerotic heart disease of native coronary artery without angina pectoris: Secondary | ICD-10-CM

## 2022-09-17 DIAGNOSIS — R42 Dizziness and giddiness: Secondary | ICD-10-CM

## 2022-09-17 DIAGNOSIS — R002 Palpitations: Secondary | ICD-10-CM | POA: Diagnosis not present

## 2022-09-17 DIAGNOSIS — E785 Hyperlipidemia, unspecified: Secondary | ICD-10-CM

## 2022-09-17 NOTE — Patient Instructions (Signed)
Medication Instructions:  Your physician recommends that you continue on your current medications as directed. Please refer to the Current Medication list given to you today.  *If you need a refill on your cardiac medications before your next appointment, please call your pharmacy*  Follow-Up: At [facility], you and your health needs are our priority.  As part of our continuing mission to provide you with exceptional heart care, we have created designated Provider Care Teams.  These Care Teams include your primary Cardiologist (physician) and Advanced Practice Providers (APPs -  Physician Assistants and Nurse Practitioners) who all work together to provide you with the care you need, when you need it.  We recommend signing up for the patient portal called "MyChart".  Sign up information is provided on this After Visit Summary.  MyChart is used to connect with patients for Virtual Visits (Telemedicine).  Patients are able to view lab/test results, encounter notes, upcoming appointments, etc.  Non-urgent messages can be sent to your provider as well.   To learn more about what you can do with MyChart, go to [email or web address].    Your next appointment:   12 month(s)  The format for your next appointment:   In Person  Provider:   Donato Heinz, MD     Other Instructions Recommend [facility]

## 2022-10-23 ENCOUNTER — Other Ambulatory Visit: Payer: Self-pay | Admitting: Internal Medicine

## 2022-10-23 DIAGNOSIS — R7401 Elevation of levels of liver transaminase levels: Secondary | ICD-10-CM

## 2022-10-30 ENCOUNTER — Ambulatory Visit
Admission: RE | Admit: 2022-10-30 | Discharge: 2022-10-30 | Disposition: A | Discharge status: Home / Self Care | Payer: Medicare Other | Source: Ambulatory Visit | Attending: Internal Medicine | Admitting: Internal Medicine

## 2022-10-30 DIAGNOSIS — R7401 Elevation of levels of liver transaminase levels: Secondary | ICD-10-CM

## 2022-11-02 ENCOUNTER — Encounter: Payer: Self-pay | Admitting: Cardiology

## 2022-11-25 ENCOUNTER — Ambulatory Visit (HOSPITAL_COMMUNITY)
Admission: RE | Admit: 2022-11-25 | Discharge: 2022-11-25 | Disposition: A | Discharge status: Home / Self Care | Payer: Medicare Other | Source: Ambulatory Visit | Attending: Adult Health | Admitting: Adult Health

## 2022-11-25 ENCOUNTER — Telehealth: Payer: Self-pay

## 2022-11-25 DIAGNOSIS — R9230 Dense breasts, unspecified: Secondary | ICD-10-CM | POA: Diagnosis present

## 2022-11-25 DIAGNOSIS — D0591 Unspecified type of carcinoma in situ of right breast: Secondary | ICD-10-CM | POA: Diagnosis present

## 2022-11-25 MED ORDER — GADOBUTROL 1 MMOL/ML IV SOLN
5.0000 mL | Freq: Once | INTRAVENOUS | Status: AC | PRN
  Administered 2022-11-25: 5 mL via INTRAVENOUS

## 2022-11-25 NOTE — Telephone Encounter (Signed)
-----   Message from Gardenia Phlegm, NP sent at 11/25/2022  3:52 PM EDT ----- MR negative for malignancy.  Please notify patient of the good news.  ----- Message ----- From: Interface, Rad Results In Sent: 11/25/2022   3:20 PM EDT To: Gardenia Phlegm, NP

## 2022-11-25 NOTE — Telephone Encounter (Signed)
Called pt to give results per NP. She verbalized thanks and understanding.  

## 2023-04-16 ENCOUNTER — Inpatient Hospital Stay: Payer: Medicare Other | Attending: Adult Health | Admitting: Adult Health

## 2023-04-16 ENCOUNTER — Encounter: Payer: Self-pay | Admitting: Adult Health

## 2023-04-16 ENCOUNTER — Other Ambulatory Visit: Payer: Self-pay

## 2023-04-16 ENCOUNTER — Telehealth: Payer: Self-pay | Admitting: Adult Health

## 2023-04-16 VITALS — BP 133/89 | HR 79 | Temp 98.1°F | Resp 18 | Ht 62.0 in | Wt 112.8 lb

## 2023-04-16 DIAGNOSIS — Z170 Estrogen receptor positive status [ER+]: Secondary | ICD-10-CM | POA: Insufficient documentation

## 2023-04-16 DIAGNOSIS — Z8249 Family history of ischemic heart disease and other diseases of the circulatory system: Secondary | ICD-10-CM | POA: Insufficient documentation

## 2023-04-16 DIAGNOSIS — D0591 Unspecified type of carcinoma in situ of right breast: Secondary | ICD-10-CM | POA: Diagnosis not present

## 2023-04-16 DIAGNOSIS — Z79899 Other long term (current) drug therapy: Secondary | ICD-10-CM | POA: Diagnosis not present

## 2023-04-16 DIAGNOSIS — R9230 Dense breasts, unspecified: Secondary | ICD-10-CM | POA: Diagnosis not present

## 2023-04-16 DIAGNOSIS — Z90710 Acquired absence of both cervix and uterus: Secondary | ICD-10-CM | POA: Insufficient documentation

## 2023-04-16 DIAGNOSIS — D0511 Intraductal carcinoma in situ of right breast: Secondary | ICD-10-CM | POA: Insufficient documentation

## 2023-04-16 DIAGNOSIS — Z8759 Personal history of other complications of pregnancy, childbirth and the puerperium: Secondary | ICD-10-CM | POA: Diagnosis not present

## 2023-04-16 NOTE — Assessment & Plan Note (Addendum)
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    History of breast cancer: And has no clinical or radiographic signs of breast cancer recurrence.  I recommended continued healthy diet exercise and follow-up with her primary care provider.  Breast density category D: Due to this she was undergoing supplemental breast MRI.  [name] now has contrast-enhanced mammography.  There is evidence that supports contrast enhanced mammography for patients with breast density category D instead of breast MRI.  [name] and I reviewed the details of this, she is in agreement and will undergo this mammogram in September instead of the regular screening mammogram.    [name] will return in 1 year for continued long term follow-up.

## 2023-04-16 NOTE — Telephone Encounter (Signed)
Per Lillard Anes, DNP, scheduled pt for MM diagnostic at [facility]. Pt called and notified of appt. Pt verbalized understanding.

## 2023-04-16 NOTE — Progress Notes (Signed)
[facility] Cancer Follow up:    Connie Fillers, MD [address] [place] [address]   DIAGNOSIS:  Cancer Staging  No matching staging information was found for the patient.   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 female returns for follow-up of her history of breast cancer.  Her most recent mammogram occurred on May 22, 2022 that demonstrated no mammographic evidence of malignancy and breast density category D.  She also underwent bilateral breast MRI that was negative.  Connie Miller is seeing her PCP regularly, she exercises when she can and is working part time as well.    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 food triggered/hormonal    SURGICAL HISTORY: Past Surgical History:  Procedure Laterality Date   ABDOMINAL  HYSTERECTOMY Bilateral 02/04/2014   Procedure: TOTAL ABDOMINAL HYSTERECTOMY, BILATERAL SALPINGO OOPHORECTOMY ;  Surgeon: Antionette Char, MD;  Location: [facility];  Service: Gynecology;  Laterality: Bilateral;   BREAST LUMPECTOMY     right breast lumpectomy, snbx   CERVICAL POLYPECTOMY  feb. 2011   DILATION AND CURETTAGE OF UTERUS     several d &c - polyps and bleeding   ECTOPIC PREGNANCY SURGERY  August 1997   Laparoscopy for ectopic   HYSTEROSCOPY WITH D & C N/A 11/05/2013   Procedure: DILATATION AND CURETTAGE ;  Surgeon: Antionette Char, MD;  Location: [facility];  Service: Gynecology;  Laterality: N/A;   with Ultrasound guidance   SKIN CANCER EXCISION  1996   removal of small spot on eyelid, noninvasive (basal cell) skin cancer   TONSILLECTOMY     WISDOM TOOTH EXTRACTION      SOCIAL HISTORY: Social History   Socioeconomic History   Marital status: Married    Spouse name: Not on file   Number of children: Not on file   Years of education: Not on file   Highest education level: Not on file  Occupational History   Not on file  Tobacco Use   Smoking status: Never   Smokeless tobacco: Never  Vaping Use   Vaping status: Never Used  Substance and Sexual Activity   Alcohol use: No    Alcohol/week: 0.0 standard drinks of alcohol   Drug use: No   Sexual activity: Yes  Partners: Male    Birth control/protection: Post-menopausal  Other Topics Concern   Not on file  Social History Narrative   Not on file   Social Determinants of Health   Financial Resource Strain: Not on file  Food Insecurity: Not on file  Transportation Needs: Not on file  Physical Activity: Not on file  Stress: Not on file  Social Connections: Not on file  Intimate Partner Violence: Not on file    FAMILY HISTORY: Family History  Problem Relation Age of Onset   Heart disease Father    Early death Cousin     Review of Systems  Constitutional:  Negative for appetite change, chills, fatigue, fever  and unexpected weight change.  HENT:   Negative for hearing loss, lump/mass and trouble swallowing.   Eyes:  Negative for eye problems and icterus.  Respiratory:  Negative for chest tightness, cough and shortness of breath.   Cardiovascular:  Negative for chest pain, leg swelling and palpitations.  Gastrointestinal:  Negative for abdominal distention, abdominal pain, constipation, diarrhea, nausea and vomiting.  Endocrine: Negative for hot flashes.  Genitourinary:  Negative for difficulty urinating.   Musculoskeletal:  Negative for arthralgias.  Skin:  Negative for itching and rash.  Neurological:  Negative for dizziness, extremity weakness, headaches and numbness.  Hematological:  Negative for adenopathy. Does not bruise/bleed easily.  Psychiatric/Behavioral:  Negative for depression. The patient is not nervous/anxious.       PHYSICAL EXAMINATION   Onc Performance Status - 04/16/23 0918       ECOG Perf Status   ECOG Perf Status Restricted in physically strenuous activity but ambulatory and able to carry out work of a light or sedentary nature, e.g., light house work, office work      KPS SCALE   KPS % SCORE Able to carry on normal activity, minor s/s of disease             Vitals:   04/16/23 0913  BP: 133/89  Pulse: 79  Resp: 18  Temp: 98.1 F (36.7 C)  SpO2: 100%    Physical Exam Constitutional:      General: She is not in acute distress.    Appearance: Normal appearance. She is not toxic-appearing.  HENT:     Head: Normocephalic and atraumatic.     Mouth/Throat:     Mouth: Mucous membranes are moist.     Pharynx: Oropharynx is clear. No oropharyngeal exudate or posterior oropharyngeal erythema.  Eyes:     General: No scleral icterus. Cardiovascular:     Rate and Rhythm: Normal rate and regular rhythm.     Pulses: Normal pulses.     Heart sounds: Normal heart sounds.  Pulmonary:     Effort: Pulmonary effort is normal.     Breath sounds: Normal breath  sounds.  Abdominal:     General: Abdomen is flat. Bowel sounds are normal. There is no distension.     Palpations: Abdomen is soft.     Tenderness: There is no abdominal tenderness.  Musculoskeletal:        General: No swelling.     Cervical back: Neck supple.  Lymphadenopathy:     Cervical: No cervical adenopathy.  Skin:    General: Skin is warm and dry.     Findings: No rash.  Neurological:     General: No focal deficit present.     Mental Status: She is alert.  Psychiatric:        Mood and Affect: Mood normal.  Behavior: Behavior normal.     LABORATORY DATA:  CBC    Component Value Date/Time   WBC 4.9 05/26/2014 0832   WBC 10.9 (H) 02/05/2014 0530   RBC 4.31 05/26/2014 0832   RBC 3.68 (L) 02/05/2014 0530   HGB 13.2 05/26/2014 0832   HCT 40.8 05/26/2014 0832   PLT 228 05/26/2014 0832   MCV 94.7 05/26/2014 0832   MCH 30.5 05/26/2014 0832   MCH 30.4 02/05/2014 0530   MCHC 32.2 05/26/2014 0832   MCHC 32.9 02/05/2014 0530   RDW 13.7 05/26/2014 0832   LYMPHSABS 1.9 05/26/2014 0832   MONOABS 0.4 05/26/2014 0832   EOSABS 0.1 05/26/2014 0832   BASOSABS 0.1 05/26/2014 0832    CMP     Component Value Date/Time   NA 144 05/26/2014 0832   K 3.4 (L) 05/26/2014 0832   CL 94 (L) 02/05/2014 0530   CO2 27 05/26/2014 0832   GLUCOSE 88 05/26/2014 0832   BUN 10.3 05/26/2014 0832   CREATININE 0.7 05/26/2014 0832   CALCIUM 9.1 05/26/2014 0832   PROT 6.8 05/26/2014 0832   ALBUMIN 3.7 05/26/2014 0832   AST 19 05/26/2014 0832   ALT 16 05/26/2014 0832   ALKPHOS 49 05/26/2014 0832   BILITOT 0.33 05/26/2014 0832   GFRNONAA >90 02/05/2014 0530   GFRAA >90 02/05/2014 0530      ASSESSMENT and THERAPY PLAN:   Cancer of right breast, stage 0 Connie Miller is a 67 year old woman with 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    History of breast cancer:  And has no clinical or radiographic signs of breast cancer recurrence.  I recommended continued healthy diet exercise and follow-up with her primary care provider.  Breast density category D: Due to this she was undergoing supplemental breast MRI.  Connie Miller now has contrast-enhanced mammography.  There is evidence that supports contrast enhanced mammography for patients with breast density category D instead of breast MRI.  Connie Miller and I reviewed the details of this, she is in agreement and will undergo this mammogram in September instead of the regular screening mammogram.    Connie Miller will return in 1 year for continued long term follow-up.    All questions were answered. The patient knows to call the clinic with any problems, questions or concerns. We can certainly see the patient much sooner if necessary.  Total encounter time:30 minutes*in face-to-face visit time, chart review, lab review, care coordination, order entry, and documentation of the encounter time.  Connie Anes, NP 04/16/23 10:16 AM Medical Oncology and Hematology [facility] [address] [place], [place] [address] Tel. [phone]    Fax. [phone]  *Total Encounter Time as defined by the Centers for Medicare and Medicaid Services includes, in addition to the face-to-face time of a patient visit (documented in the note above) non-face-to-face time: obtaining and reviewing outside history, ordering and reviewing medications, tests or procedures, care coordination (communications with other health care professionals or caregivers) and documentation in the medical record.

## 2023-04-16 NOTE — Addendum Note (Signed)
Addended by: Noreene Filbert on: 04/16/2023 10:18 AM   Modules accepted: Orders

## 2023-05-06 ENCOUNTER — Other Ambulatory Visit: Payer: Self-pay | Admitting: Adult Health

## 2023-05-06 DIAGNOSIS — D0591 Unspecified type of carcinoma in situ of right breast: Secondary | ICD-10-CM

## 2023-05-06 DIAGNOSIS — R9230 Dense breasts, unspecified: Secondary | ICD-10-CM

## 2023-05-19 ENCOUNTER — Inpatient Hospital Stay: Payer: Medicare Other | Attending: Adult Health

## 2023-05-19 DIAGNOSIS — D0591 Unspecified type of carcinoma in situ of right breast: Secondary | ICD-10-CM

## 2023-05-19 DIAGNOSIS — D0511 Intraductal carcinoma in situ of right breast: Secondary | ICD-10-CM | POA: Diagnosis present

## 2023-05-19 DIAGNOSIS — Z79899 Other long term (current) drug therapy: Secondary | ICD-10-CM | POA: Diagnosis not present

## 2023-05-19 LAB — CMP (CANCER CENTER ONLY)
ALT: 22 U/L (ref 0–44)
AST: 19 U/L (ref 15–41)
Albumin: 4.2 g/dL (ref 3.5–5.0)
Alkaline Phosphatase: 47 U/L (ref 38–126)
Anion gap: 5 (ref 5–15)
BUN: 15 mg/dL (ref 8–23)
CO2: 29 mmol/L (ref 22–32)
Calcium: 9.3 mg/dL (ref 8.9–10.3)
Chloride: 106 mmol/L (ref 98–111)
Creatinine: 0.61 mg/dL (ref 0.44–1.00)
GFR, Estimated: >60 mL/min (ref >60)
Glucose, Bld: 126 mg/dL — ABNORMAL HIGH (ref 70–99)
Potassium: 3.9 mmol/L (ref 3.5–5.1)
Sodium: 140 mmol/L (ref 135–145)
Total Bilirubin: 0.5 mg/dL (ref 0.3–1.2)
Total Protein: 6.8 g/dL (ref 6.5–8.1)

## 2023-05-29 ENCOUNTER — Encounter: Payer: Self-pay | Admitting: Adult Health

## 2023-09-19 ENCOUNTER — Encounter (HOSPITAL_BASED_OUTPATIENT_CLINIC_OR_DEPARTMENT_OTHER): Payer: Self-pay | Admitting: Cardiology

## 2023-09-19 MED ORDER — MECLIZINE HCL 12.5 MG PO TABS: 12.5000 mg | ORAL_TABLET | Freq: Three times a day (TID) | ORAL | 1 refills | Status: AC | PRN

## 2023-09-19 NOTE — Telephone Encounter (Signed)
 Called and left patient a VM that Meclizine 12.5 mg sent to pharmacy per Dr. Bjorn Pippin recommendations. Left message for patient to call office to schedule visit.

## 2023-10-16 NOTE — Progress Notes (Signed)
 Cardiology Office Note:    Date:  10/18/2023   ID:  Connie Miller, DOB 1955-12-05, MRN [id number]  PCP:  Yolande Toribio MATSU, MD  Cardiologist:  Lonni LITTIE Nanas, MD  Electrophysiologist:  None   Referring MD: Yolande Toribio MATSU, MD   Chief Complaint  Patient presents with   Coronary Artery Disease     History of Present Illness:    Connie Miller is a 68 y.o. female with a hx of breast cancer, skin cancer, hyperlipidemia who presents for follow-up.  She was referred by Dr. Yolande for evaluation of palpitations, initially seen on 12/21/2019.  She reports that she received her second COVID-19 vaccine on March 10.  At night she had what she thought was a panic attack.  States that her heart was racing and she felt very anxious.  Episode lasted for hours.  She has had a couple more episodes of this since that time.  States that she has been walking every morning for 45 minutes.  She denies any dyspnea or chest pain.  Smoked briefly when younger.  Father had MI at age 86, died of MI at age 16.  Zio patch x14 days on 01/31/2020 showed one 8 beat episode of NSVT.  TTE on 02/28/20 showed normal biventricular function, no significant valvular disease.  Calcium  score on 12/23/2019 was 125 (85th percentile).  Since last clinic visit, she reports she is doing okay.  Having palpitations about once per month.  She had Kardia mobile device but has not used yet.  Reports palpitations last about 10 seconds.  Denies any chest pain, dyspnea, syncope, lower extremity edema.  She exercises 3 times per week, does aerobics and weightbearing activities for about an hour.  Does report having some vertigo recently.   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 food triggered/hormonal    Past Surgical History:  Procedure Laterality Date   ABDOMINAL HYSTERECTOMY Bilateral 02/04/2014   Procedure: TOTAL ABDOMINAL HYSTERECTOMY, BILATERAL SALPINGO OOPHORECTOMY ;  Surgeon: Olam Mill, MD;  Location: [facility];  Service: Gynecology;  Laterality: Bilateral;   BREAST LUMPECTOMY     right breast lumpectomy, snbx   CERVICAL POLYPECTOMY  feb. 2011   DILATION AND CURETTAGE OF UTERUS     several d &c - polyps and bleeding   ECTOPIC PREGNANCY SURGERY  August 1997   Laparoscopy for ectopic   HYSTEROSCOPY WITH D & C N/A 11/05/2013   Procedure: DILATATION AND CURETTAGE ;  Surgeon: Olam Mill, MD;  Location: [facility];  Service: Gynecology;  Laterality: N/A;   with Ultrasound guidance   SKIN CANCER EXCISION  1996   removal of small spot on eyelid, noninvasive (basal cell) skin cancer   TONSILLECTOMY     WISDOM TOOTH EXTRACTION      Current Medications: Current Meds  Medication Sig   atorvastatin  (LIPITOR) 40 MG tablet Take 40 mg by mouth daily.   Cholecalciferol (VITAMIN D3) 25 MCG (1000 UT) CAPS Take 2,000 Units by mouth daily. Pt takes 2,000 units daily   CRANBERRY PO Take by mouth daily.   meclizine  (ANTIVERT ) 12.5 MG tablet Take 1  tablet (12.5 mg total) by mouth 3 (three) times daily as needed for dizziness.   Multiple Vitamins-Minerals (CENTRUM SILVER PO) Take 1 tablet by mouth daily.     SUMAtriptan (IMITREX) 100 MG tablet Take 100 mg by mouth as needed for migraine.      Allergies:   Bupropion and Fluoxetine   Social History   Socioeconomic History   Marital status: Married    Spouse name: Not on file   Number of children: Not on file   Years of education: Not on file   Highest education level: Not on file  Occupational History   Not on file  Tobacco Use   Smoking status: Never   Smokeless tobacco: Never  Vaping Use   Vaping status: Never Used  Substance and Sexual Activity   Alcohol use: No     Alcohol/week: 0.0 standard drinks of alcohol   Drug use: No   Sexual activity: Yes    Partners: Male    Birth control/protection: Post-menopausal  Other Topics Concern   Not on file  Social History Narrative   Not on file   Social Drivers of Health   Financial Resource Strain: Not on file  Food Insecurity: Not on file  Transportation Needs: Not on file  Physical Activity: Not on file  Stress: Not on file  Social Connections: Not on file     Family History: The patient's family history includes Early death in her cousin; Heart disease in her father.  ROS:   Please see the history of present illness.    All other systems reviewed and are negative.  EKGs/Labs/Other Studies Reviewed:    The following studies were reviewed today:   EKG:   10/17/2023: Normal sinus rhythm, rate 77, less than 1 mm ST depressions in inferior leads 09/17/2022: Normal sinus rhythm, rate 64, nonspecific T wave flattening 07/22: sinus rhythm, rate 68, no ST abnormalities 07/21: NSR, rate 97  Recent Labs: 05/19/2023: ALT 22; BUN 15; Creatinine 0.61; Potassium 3.9; Sodium 140  Recent Lipid Panel    Component Value Date/Time   CHOL 136 03/29/2020 0944   TRIG 46 03/29/2020 0944   HDL 69 03/29/2020 0944   CHOLHDL 2.0 03/29/2020 0944   LDLCALC 56 03/29/2020 0944    Physical Exam:    VS:  BP 126/83   Pulse 77   Ht 5' 2 (1.575 m)   Wt 113 lb 6.4 oz (51.4 kg)   SpO2 96%   BMI 20.74 kg/m     Wt Readings from Last 3 Encounters:  10/17/23 113 lb 6.4 oz (51.4 kg)  04/16/23 112 lb 12.8 oz (51.2 kg)  09/17/22 110 lb 6.4 oz (50.1 kg)     GEN:  Well nourished, well developed in no acute distress HEENT: Normal NECK: No JVD; No carotid bruits CARDIAC: RRR, no murmurs, rubs, gallops RESPIRATORY:  Clear to auscultation without rales, wheezing or rhonchi  ABDOMEN: Soft, non-tender, non-distended MUSCULOSKELETAL:  No edema; No deformity  SKIN: Warm and dry NEUROLOGIC:  Alert and oriented x  3 PSYCHIATRIC:  Normal affect   ASSESSMENT:    1. Coronary artery disease involving native coronary artery of native heart without angina pectoris   2. Palpitations   3. Lightheadedness   4. Hyperlipidemia, unspecified hyperlipidemia type      PLAN:    CAD: Calcium  score on 12/23/2019 was 125 (85th percentile).  Denies any anginal symptoms. -Continue atorvastatin  40 mg daily  Palpitations: description concerning for panic attack.  Zio patch x14 days  showed no significant arrhythmia.  Did have one 8 beat episode of NSVT.  Echocardiogram shows no structural heart disease.   -She reports palpitations have improved, now occurring rarely.  Discussed Kardia mobile device for further monitoring  Lightheadedness: Describes sensation of the room is spinning with certain head movements, suggestive of BPPV.  Has as needed meclizine   Hyperlipidemia: On atorvastatin  40 mg daily.  LDL 72 on 10/15/2022.  Calcium  score on 12/23/2019 was 125 (85th percentile).  Has upcoming labs with PCP, will follow-up results.  Goal LDL less than 70  RTC in 1 year   Medication Adjustments/Labs and Tests Ordered: Current medicines are reviewed at length with the patient today.  Concerns regarding medicines are outlined above.  Orders Placed This Encounter  Procedures   EKG 12-Lead    No orders of the defined types were placed in this encounter.    Patient Instructions  Medication Instructions:  Continue current medication *If you need a refill on your cardiac medications before your next appointment, please call your pharmacy*   Lab Work: none If you have labs (blood work) drawn today and your tests are completely normal, you will receive your results only by: MyChart Message (if you have MyChart) OR A paper copy in the mail If you have any lab test that is abnormal or we need to change your treatment, we will call you to review the results.   Testing/Procedures: none   Follow-Up: At [facility], you and your health needs are our priority.  As part of our continuing mission to provide you with exceptional heart care, we have created designated Provider Care Teams.  These Care Teams include your primary Cardiologist (physician) and Advanced Practice Providers (APPs -  Physician Assistants and Nurse Practitioners) who all work together to provide you with the care you need, when you need it.  We recommend signing up for the patient portal called MyChart.  Sign up information is provided on this After Visit Summary.  MyChart is used to connect with patients for Virtual Visits (Telemedicine).  Patients are able to view lab/test results, encounter notes, upcoming appointments, etc.  Non-urgent messages can be sent to your provider as well.   To learn more about what you can do with MyChart, go to [email or web address].    Your next appointment:   1 year(s)  Provider:   Lonni LITTIE Nanas, MD     Other Instructions none         Signed, Lonni LITTIE Nanas, MD  10/18/2023 8:17 PM    [facility]

## 2023-10-17 ENCOUNTER — Ambulatory Visit: Payer: Medicare Other | Attending: Cardiology | Admitting: Cardiology

## 2023-10-17 ENCOUNTER — Encounter: Payer: Self-pay | Admitting: Cardiology

## 2023-10-17 VITALS — BP 126/83 | HR 77 | Ht 62.0 in | Wt 113.4 lb

## 2023-10-17 DIAGNOSIS — I2510 Atherosclerotic heart disease of native coronary artery without angina pectoris: Secondary | ICD-10-CM

## 2023-10-17 DIAGNOSIS — E785 Hyperlipidemia, unspecified: Secondary | ICD-10-CM

## 2023-10-17 DIAGNOSIS — R002 Palpitations: Secondary | ICD-10-CM | POA: Diagnosis not present

## 2023-10-17 DIAGNOSIS — R42 Dizziness and giddiness: Secondary | ICD-10-CM

## 2023-10-17 NOTE — Patient Instructions (Signed)
 Medication Instructions:  Continue current medication *If you need a refill on your cardiac medications before your next appointment, please call your pharmacy*   Lab Work: none If you have labs (blood work) drawn today and your tests are completely normal, you will receive your results only by: MyChart Message (if you have MyChart) OR A paper copy in the mail If you have any lab test that is abnormal or we need to change your treatment, we will call you to review the results.   Testing/Procedures: none   Follow-Up: At [facility], you and your health needs are our priority.  As part of our continuing mission to provide you with exceptional heart care, we have created designated Provider Care Teams.  These Care Teams include your primary Cardiologist (physician) and Advanced Practice Providers (APPs -  Physician Assistants and Nurse Practitioners) who all work together to provide you with the care you need, when you need it.  We recommend signing up for the patient portal called MyChart.  Sign up information is provided on this After Visit Summary.  MyChart is used to connect with patients for Virtual Visits (Telemedicine).  Patients are able to view lab/test results, encounter notes, upcoming appointments, etc.  Non-urgent messages can be sent to your provider as well.   To learn more about what you can do with MyChart, go to [email or web address].    Your next appointment:   1 year(s)  Provider:   Lonni LITTIE Nanas, MD     Other Instructions none

## 2024-04-15 ENCOUNTER — Telehealth: Payer: Self-pay

## 2024-04-15 ENCOUNTER — Inpatient Hospital Stay: Payer: Medicare Other | Attending: Adult Health | Admitting: Adult Health

## 2024-04-15 DIAGNOSIS — N6091 Unspecified benign mammary dysplasia of right breast: Secondary | ICD-10-CM | POA: Diagnosis not present

## 2024-04-15 DIAGNOSIS — Z8759 Personal history of other complications of pregnancy, childbirth and the puerperium: Secondary | ICD-10-CM | POA: Diagnosis not present

## 2024-04-15 DIAGNOSIS — Z8249 Family history of ischemic heart disease and other diseases of the circulatory system: Secondary | ICD-10-CM | POA: Insufficient documentation

## 2024-04-15 DIAGNOSIS — R92343 Mammographic extreme density, bilateral breasts: Secondary | ICD-10-CM

## 2024-04-15 DIAGNOSIS — R9230 Dense breasts, unspecified: Secondary | ICD-10-CM | POA: Diagnosis not present

## 2024-04-15 DIAGNOSIS — D0511 Intraductal carcinoma in situ of right breast: Secondary | ICD-10-CM | POA: Diagnosis present

## 2024-04-15 DIAGNOSIS — Z90710 Acquired absence of both cervix and uterus: Secondary | ICD-10-CM | POA: Diagnosis not present

## 2024-04-15 DIAGNOSIS — Z79899 Other long term (current) drug therapy: Secondary | ICD-10-CM | POA: Diagnosis not present

## 2024-04-15 DIAGNOSIS — Z79810 Long term (current) use of selective estrogen receptor modulators (SERMs): Secondary | ICD-10-CM | POA: Diagnosis not present

## 2024-04-15 DIAGNOSIS — D0591 Unspecified type of carcinoma in situ of right breast: Secondary | ICD-10-CM | POA: Diagnosis not present

## 2024-04-15 DIAGNOSIS — Z923 Personal history of irradiation: Secondary | ICD-10-CM | POA: Diagnosis not present

## 2024-04-15 NOTE — Telephone Encounter (Signed)
 please fax mammo orders to [facility] for this patient   Orders faxed and confirmation received

## 2024-04-15 NOTE — Progress Notes (Signed)
 [facility] Cancer Follow up:    Yolande Toribio MATSU, MD [address] 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  History of Present Illness Connie Miller is a 68 year old female with a history of right breast DCIS, ERPR positive, who presents for follow-up after recent imaging studies.  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 She underwent lumpectomy, adjuvant radiation, and completed five years of tamoxifen  therapy in June 2017. Her recent mammogram on May 26, 2023, and a breast MRI in March 2024 showed no evidence of malignancy, but both noted breast density category D. She is scheduled for a contrast-enhanced mammogram at the end of September 2025.  She feels anxious over the past year, particularly regarding the scheduling of her breast imaging, and has difficulty navigating the process due to her dense breasts. She experiences stress related to caring for her elderly mother, which she believes contributes to her elevated blood pressure. She acknowledges the need for lifestyle changes to manage her blood pressure.     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 food triggered/hormonal    SURGICAL HISTORY: Past Surgical History:  Procedure Laterality Date   ABDOMINAL HYSTERECTOMY Bilateral 02/04/2014   Procedure: TOTAL ABDOMINAL HYSTERECTOMY, BILATERAL SALPINGO OOPHORECTOMY ;  Surgeon: Olam Mill, MD;  Location: [facility];  Service: Gynecology;  Laterality: Bilateral;   BREAST LUMPECTOMY     right breast lumpectomy, snbx   CERVICAL POLYPECTOMY  feb. 2011   DILATION AND CURETTAGE OF UTERUS     several d &c - polyps and bleeding   ECTOPIC PREGNANCY SURGERY  August 1997   Laparoscopy for ectopic   HYSTEROSCOPY WITH D & C N/A 11/05/2013   Procedure: DILATATION AND CURETTAGE ;  Surgeon: Olam Mill, MD;  Location: [facility];  Service: Gynecology;  Laterality: N/A;   with Ultrasound guidance   SKIN CANCER EXCISION  1996   removal of small spot on eyelid, noninvasive (basal cell) skin cancer   TONSILLECTOMY     WISDOM TOOTH EXTRACTION  SOCIAL HISTORY: Social History   Socioeconomic History   Marital status: Married    Spouse name: Not on file   Number of children: Not on file   Years of education: Not on file   Highest education level: Not on file  Occupational History   Not on file  Tobacco Use   Smoking status: Never   Smokeless tobacco: Never  Vaping Use   Vaping status: Never Used  Substance and Sexual Activity   Alcohol use: No    Alcohol/week: 0.0 standard  drinks of alcohol   Drug use: No   Sexual activity: Yes    Partners: Male    Birth control/protection: Post-menopausal  Other Topics Concern   Not on file  Social History Narrative   Not on file   Social Drivers of Health   Financial Resource Strain: Not on file  Food Insecurity: Not on file  Transportation Needs: Not on file  Physical Activity: Not on file  Stress: Not on file  Social Connections: Not on file  Intimate Partner Violence: Not on file    FAMILY HISTORY: Family History  Problem Relation Age of Onset   Heart disease Father    Early death Cousin     Review of Systems  Constitutional:  Negative for appetite change, chills, fatigue, fever and unexpected weight change.  HENT:   Negative for hearing loss, lump/mass and trouble swallowing.   Eyes:  Negative for eye problems and icterus.  Respiratory:  Negative for chest tightness, cough and shortness of breath.   Cardiovascular:  Negative for chest pain, leg swelling and palpitations.  Gastrointestinal:  Negative for abdominal distention, abdominal pain, constipation, diarrhea, nausea and vomiting.  Endocrine: Negative for hot flashes.  Genitourinary:  Negative for difficulty urinating.   Musculoskeletal:  Negative for arthralgias.  Skin:  Negative for itching and rash.  Neurological:  Negative for dizziness, extremity weakness, headaches and numbness.  Hematological:  Negative for adenopathy. Does not bruise/bleed easily.  Psychiatric/Behavioral:  Negative for depression. The patient is not nervous/anxious.       PHYSICAL EXAMINATION  Vitals reviewed and stable  Physical Exam Constitutional:      General: She is not in acute distress.    Appearance: Normal appearance. She is not toxic-appearing.  HENT:     Head: Normocephalic and atraumatic.     Mouth/Throat:     Mouth: Mucous membranes are moist.     Pharynx: Oropharynx is clear. No oropharyngeal exudate or posterior oropharyngeal erythema.  Eyes:      General: No scleral icterus. Cardiovascular:     Rate and Rhythm: Normal rate and regular rhythm.     Pulses: Normal pulses.     Heart sounds: Normal heart sounds.  Pulmonary:     Effort: Pulmonary effort is normal.     Breath sounds: Normal breath sounds.  Chest:     Comments: Right breast s/p lumpectomy and radiation, no sign of local recurrence, left breast benign Abdominal:     General: Abdomen is flat. Bowel sounds are normal. There is no distension.     Palpations: Abdomen is soft.     Tenderness: There is no abdominal tenderness.  Musculoskeletal:        General: No swelling.     Cervical back: Neck supple.  Lymphadenopathy:     Cervical: No cervical adenopathy.     Upper Body:     Right upper body: No supraclavicular or axillary adenopathy.     Left upper body: No supraclavicular  or axillary adenopathy.  Skin:    General: Skin is warm and dry.     Findings: No rash.  Neurological:     General: No focal deficit present.     Mental Status: She is alert.  Psychiatric:        Mood and Affect: Mood normal.        Behavior: Behavior normal.     LABORATORY DATA:  CBC    Component Value Date/Time   WBC 4.9 05/26/2014 0832   WBC 10.9 (H) 02/05/2014 0530   RBC 4.31 05/26/2014 0832   RBC 3.68 (L) 02/05/2014 0530   HGB 13.2 05/26/2014 0832   HCT 40.8 05/26/2014 0832   PLT 228 05/26/2014 0832   MCV 94.7 05/26/2014 0832   MCH 30.5 05/26/2014 0832   MCH 30.4 02/05/2014 0530   MCHC 32.2 05/26/2014 0832   MCHC 32.9 02/05/2014 0530   RDW 13.7 05/26/2014 0832   LYMPHSABS 1.9 05/26/2014 0832   MONOABS 0.4 05/26/2014 0832   EOSABS 0.1 05/26/2014 0832   BASOSABS 0.1 05/26/2014 0832    CMP     Component Value Date/Time   NA 140 05/19/2023 1202   NA 144 05/26/2014 0832   K 3.9 05/19/2023 1202   K 3.4 (L) 05/26/2014 0832   CL 106 05/19/2023 1202   CO2 29 05/19/2023 1202   CO2 27 05/26/2014 0832   GLUCOSE 126 (H) 05/19/2023 1202   GLUCOSE 88 05/26/2014 0832   BUN  15 05/19/2023 1202   BUN 10.3 05/26/2014 0832   CREATININE 0.61 05/19/2023 1202   CREATININE 0.7 05/26/2014 0832   CALCIUM  9.3 05/19/2023 1202   CALCIUM  9.1 05/26/2014 0832   PROT 6.8 05/19/2023 1202   PROT 6.8 05/26/2014 0832   ALBUMIN 4.2 05/19/2023 1202   ALBUMIN 3.7 05/26/2014 0832   AST 19 05/19/2023 1202   AST 19 05/26/2014 0832   ALT 22 05/19/2023 1202   ALT 16 05/26/2014 0832   ALKPHOS 47 05/19/2023 1202   ALKPHOS 49 05/26/2014 0832   BILITOT 0.5 05/19/2023 1202   BILITOT 0.33 05/26/2014 0832   GFRNONAA >60 05/19/2023 1202   GFRAA >90 02/05/2014 0530     ASSESSMENT and THERAPY PLAN:   Assessment and Plan Assessment & Plan Personal history of right breast DCIS, ERPR positive, status post lumpectomy and adjuvant therapy, under active breast cancer surveillance with dense breasts Diagnosed with right breast DCIS, ERPR positive, in March 2012, treated with lumpectomy, adjuvant radiation, and completed five years of tamoxifen  in June 2017. Recent imaging showed no malignancy but dense breasts. Reassured her about no recurrence. - Order contrast-enhanced mammogram for end of September 2025. - Instruct her to contact provider if scheduling issues arise.  Hypertension Reports elevated blood pressure, likely stress-related. Acknowledges need for lifestyle modifications. No heart failure. - Encourage stress management and increased physical activity. - Advise consultation with primary care provider if blood pressure remains elevated.  RTC in one year for continued long term follow-up.    All questions were answered. The patient knows to call the clinic with any problems, questions or concerns. We can certainly see the patient much sooner if necessary.  Total encounter time:30 minutes*in face-to-face visit time, chart review, lab review, care coordination, order entry, and documentation of the encounter time.    Morna Kendall, NP 04/18/24 11:10 AM Medical Oncology and  Hematology [facility] [address] [place], [place] [address] Tel. [phone]    Fax. [phone]  *Total Encounter Time as  defined by the Centers for Medicare and Medicaid Services includes, in addition to the face-to-face time of a patient visit (documented in the note above) non-face-to-face time: obtaining and reviewing outside history, ordering and reviewing medications, tests or procedures, care coordination (communications with other health care professionals or caregivers) and documentation in the medical record.

## 2024-04-27 ENCOUNTER — Encounter: Payer: Self-pay | Admitting: Adult Health

## 2024-05-13 ENCOUNTER — Other Ambulatory Visit: Payer: Self-pay | Admitting: *Deleted

## 2024-05-13 DIAGNOSIS — D0591 Unspecified type of carcinoma in situ of right breast: Secondary | ICD-10-CM

## 2024-06-16 ENCOUNTER — Inpatient Hospital Stay: Attending: Adult Health

## 2024-06-16 DIAGNOSIS — I10 Essential (primary) hypertension: Secondary | ICD-10-CM | POA: Insufficient documentation

## 2024-06-16 DIAGNOSIS — D0511 Intraductal carcinoma in situ of right breast: Secondary | ICD-10-CM | POA: Diagnosis present

## 2024-06-16 DIAGNOSIS — Z8249 Family history of ischemic heart disease and other diseases of the circulatory system: Secondary | ICD-10-CM | POA: Insufficient documentation

## 2024-06-16 DIAGNOSIS — Z79899 Other long term (current) drug therapy: Secondary | ICD-10-CM | POA: Insufficient documentation

## 2024-06-16 DIAGNOSIS — D0591 Unspecified type of carcinoma in situ of right breast: Secondary | ICD-10-CM

## 2024-06-16 LAB — CMP (CANCER CENTER ONLY)
ALT: 19 U/L (ref 0–44)
AST: 16 U/L (ref 15–41)
Albumin: 4.4 g/dL (ref 3.5–5.0)
Alkaline Phosphatase: 54 U/L (ref 38–126)
Anion gap: 4 — ABNORMAL LOW (ref 5–15)
BUN: 14 mg/dL (ref 8–23)
CO2: 31 mmol/L (ref 22–32)
Calcium: 9.8 mg/dL (ref 8.9–10.3)
Chloride: 105 mmol/L (ref 98–111)
Creatinine: 0.6 mg/dL (ref 0.44–1.00)
GFR, Estimated: >60 mL/min (ref >60)
Glucose, Bld: 70 mg/dL (ref 70–99)
Potassium: 4.3 mmol/L (ref 3.5–5.1)
Sodium: 140 mmol/L (ref 135–145)
Total Bilirubin: 0.5 mg/dL (ref 0.0–1.2)
Total Protein: 7.1 g/dL (ref 6.5–8.1)

## 2024-06-24 ENCOUNTER — Encounter: Payer: Self-pay | Admitting: Adult Health

## 2025-04-18 ENCOUNTER — Inpatient Hospital Stay: Attending: Adult Health | Admitting: Adult Health
